# Patient Record
Sex: Male | Born: 1935 | Race: White | Hispanic: No | Marital: Married | Smoking: Former smoker
Health system: Southern US, Community
[De-identification: ages and names within clinical notes are randomized; demographics above are authoritative.]

## PROBLEM LIST (undated history)

## (undated) DIAGNOSIS — I4891 Unspecified atrial fibrillation: Secondary | ICD-10-CM

## (undated) DIAGNOSIS — E119 Type 2 diabetes mellitus without complications: Secondary | ICD-10-CM

## (undated) DIAGNOSIS — I10 Essential (primary) hypertension: Secondary | ICD-10-CM

## (undated) DIAGNOSIS — F0390 Unspecified dementia without behavioral disturbance: Secondary | ICD-10-CM

## (undated) HISTORY — PX: CARDIAC CATHETERIZATION: SHX172

## (undated) HISTORY — PX: CHOLECYSTECTOMY: SHX55

---

## 2005-04-10 ENCOUNTER — Ambulatory Visit: Payer: Self-pay | Admitting: Unknown Physician Specialty

## 2007-07-03 ENCOUNTER — Ambulatory Visit: Payer: Self-pay | Admitting: Unknown Physician Specialty

## 2012-06-06 ENCOUNTER — Emergency Department: Payer: Self-pay | Admitting: Emergency Medicine

## 2012-06-06 LAB — CBC WITH DIFFERENTIAL/PLATELET
Basophil #: 0 10*3/uL (ref 0.0–0.1)
Eosinophil %: 1.4 %
HCT: 39 % — ABNORMAL LOW (ref 40.0–52.0)
Lymphocyte #: 2.1 10*3/uL (ref 1.0–3.6)
MCHC: 32.9 g/dL (ref 32.0–36.0)
MCV: 93 fL (ref 80–100)
Neutrophil %: 62.7 %
Platelet: 279 10*3/uL (ref 150–440)
RBC: 4.2 10*6/uL — ABNORMAL LOW (ref 4.40–5.90)
RDW: 14.1 % (ref 11.5–14.5)
WBC: 9.6 10*3/uL (ref 3.8–10.6)

## 2012-06-06 LAB — URINALYSIS, COMPLETE
Bacteria: NONE SEEN
Blood: NEGATIVE
Glucose,UR: NEGATIVE mg/dL (ref 0–75)
Ketone: NEGATIVE
Leukocyte Esterase: NEGATIVE
WBC UR: <1 /HPF (ref 0–5)

## 2012-06-06 LAB — COMPREHENSIVE METABOLIC PANEL
Albumin: 3.8 g/dL (ref 3.4–5.0)
Anion Gap: 3 — ABNORMAL LOW (ref 7–16)
Bilirubin,Total: 0.8 mg/dL (ref 0.2–1.0)
Calcium, Total: 9.3 mg/dL (ref 8.5–10.1)
Chloride: 102 mmol/L (ref 98–107)
Co2: 32 mmol/L (ref 21–32)
Creatinine: 0.96 mg/dL (ref 0.60–1.30)
EGFR (African American): >60
Glucose: 145 mg/dL — ABNORMAL HIGH (ref 65–99)
Osmolality: 279 (ref 275–301)
SGOT(AST): 30 U/L (ref 15–37)
Total Protein: 8.6 g/dL — ABNORMAL HIGH (ref 6.4–8.2)

## 2012-06-06 LAB — PROTIME-INR
INR: 2.1
Prothrombin Time: 23.9 secs — ABNORMAL HIGH (ref 11.5–14.7)

## 2012-06-06 LAB — APTT: Activated PTT: 36 secs — ABNORMAL HIGH (ref 23.6–35.9)

## 2012-06-24 ENCOUNTER — Ambulatory Visit: Payer: Self-pay | Admitting: Family Medicine

## 2012-10-14 ENCOUNTER — Ambulatory Visit: Payer: Self-pay | Admitting: Specialist

## 2013-04-20 ENCOUNTER — Ambulatory Visit: Payer: Self-pay | Admitting: Specialist

## 2014-04-23 ENCOUNTER — Ambulatory Visit: Payer: Self-pay | Admitting: Specialist

## 2015-09-01 ENCOUNTER — Other Ambulatory Visit: Payer: Self-pay | Admitting: Neurology

## 2015-09-01 DIAGNOSIS — G301 Alzheimer's disease with late onset: Principal | ICD-10-CM

## 2015-09-01 DIAGNOSIS — F0280 Dementia in other diseases classified elsewhere without behavioral disturbance: Secondary | ICD-10-CM

## 2015-09-07 ENCOUNTER — Ambulatory Visit
Admission: RE | Admit: 2015-09-07 | Discharge: 2015-09-07 | Disposition: A | Discharge status: Home / Self Care | Payer: Medicare Other | Source: Ambulatory Visit | Attending: Neurology | Admitting: Neurology

## 2015-09-07 DIAGNOSIS — F0280 Dementia in other diseases classified elsewhere without behavioral disturbance: Secondary | ICD-10-CM

## 2015-09-07 DIAGNOSIS — G301 Alzheimer's disease with late onset: Secondary | ICD-10-CM

## 2015-09-07 DIAGNOSIS — F0390 Unspecified dementia without behavioral disturbance: Secondary | ICD-10-CM | POA: Insufficient documentation

## 2017-05-21 ENCOUNTER — Emergency Department: Payer: Medicare Other

## 2017-05-21 ENCOUNTER — Encounter: Payer: Self-pay | Admitting: Intensive Care

## 2017-05-21 ENCOUNTER — Inpatient Hospital Stay
Admission: EM | Admit: 2017-05-21 | Discharge: 2017-05-28 | DRG: 481 | Disposition: A | Discharge status: Skilled Nursing Facility | Payer: Medicare Other | Attending: Internal Medicine | Admitting: Internal Medicine

## 2017-05-21 DIAGNOSIS — F0150 Vascular dementia without behavioral disturbance: Secondary | ICD-10-CM | POA: Diagnosis present

## 2017-05-21 DIAGNOSIS — G309 Alzheimer's disease, unspecified: Secondary | ICD-10-CM | POA: Diagnosis present

## 2017-05-21 DIAGNOSIS — R1310 Dysphagia, unspecified: Secondary | ICD-10-CM | POA: Diagnosis not present

## 2017-05-21 DIAGNOSIS — R627 Adult failure to thrive: Secondary | ICD-10-CM | POA: Diagnosis not present

## 2017-05-21 DIAGNOSIS — E119 Type 2 diabetes mellitus without complications: Secondary | ICD-10-CM | POA: Diagnosis present

## 2017-05-21 DIAGNOSIS — I340 Nonrheumatic mitral (valve) insufficiency: Secondary | ICD-10-CM | POA: Diagnosis not present

## 2017-05-21 DIAGNOSIS — F0280 Dementia in other diseases classified elsewhere without behavioral disturbance: Secondary | ICD-10-CM | POA: Diagnosis present

## 2017-05-21 DIAGNOSIS — S72141A Displaced intertrochanteric fracture of right femur, initial encounter for closed fracture: Principal | ICD-10-CM | POA: Diagnosis present

## 2017-05-21 DIAGNOSIS — Z66 Do not resuscitate: Secondary | ICD-10-CM | POA: Diagnosis not present

## 2017-05-21 DIAGNOSIS — D62 Acute posthemorrhagic anemia: Secondary | ICD-10-CM | POA: Diagnosis not present

## 2017-05-21 DIAGNOSIS — R791 Abnormal coagulation profile: Secondary | ICD-10-CM | POA: Diagnosis not present

## 2017-05-21 DIAGNOSIS — L8990 Pressure ulcer of unspecified site, unspecified stage: Secondary | ICD-10-CM | POA: Diagnosis present

## 2017-05-21 DIAGNOSIS — M25551 Pain in right hip: Secondary | ICD-10-CM | POA: Diagnosis present

## 2017-05-21 DIAGNOSIS — S72001A Fracture of unspecified part of neck of right femur, initial encounter for closed fracture: Secondary | ICD-10-CM | POA: Diagnosis not present

## 2017-05-21 DIAGNOSIS — I482 Chronic atrial fibrillation: Secondary | ICD-10-CM | POA: Diagnosis present

## 2017-05-21 DIAGNOSIS — Z7984 Long term (current) use of oral hypoglycemic drugs: Secondary | ICD-10-CM | POA: Diagnosis not present

## 2017-05-21 DIAGNOSIS — J449 Chronic obstructive pulmonary disease, unspecified: Secondary | ICD-10-CM | POA: Diagnosis present

## 2017-05-21 DIAGNOSIS — Z7901 Long term (current) use of anticoagulants: Secondary | ICD-10-CM

## 2017-05-21 DIAGNOSIS — E040 Nontoxic diffuse goiter: Secondary | ICD-10-CM

## 2017-05-21 DIAGNOSIS — W1830XA Fall on same level, unspecified, initial encounter: Secondary | ICD-10-CM | POA: Diagnosis present

## 2017-05-21 DIAGNOSIS — E042 Nontoxic multinodular goiter: Secondary | ICD-10-CM | POA: Diagnosis present

## 2017-05-21 DIAGNOSIS — I10 Essential (primary) hypertension: Secondary | ICD-10-CM | POA: Diagnosis present

## 2017-05-21 DIAGNOSIS — Z87891 Personal history of nicotine dependence: Secondary | ICD-10-CM

## 2017-05-21 DIAGNOSIS — Z79899 Other long term (current) drug therapy: Secondary | ICD-10-CM

## 2017-05-21 DIAGNOSIS — T148XXA Other injury of unspecified body region, initial encounter: Secondary | ICD-10-CM

## 2017-05-21 DIAGNOSIS — Z515 Encounter for palliative care: Secondary | ICD-10-CM | POA: Diagnosis not present

## 2017-05-21 DIAGNOSIS — E876 Hypokalemia: Secondary | ICD-10-CM | POA: Diagnosis not present

## 2017-05-21 HISTORY — DX: Unspecified atrial fibrillation: I48.91

## 2017-05-21 HISTORY — DX: Essential (primary) hypertension: I10

## 2017-05-21 HISTORY — DX: Unspecified dementia, unspecified severity, without behavioral disturbance, psychotic disturbance, mood disturbance, and anxiety: F03.90

## 2017-05-21 HISTORY — DX: Type 2 diabetes mellitus without complications: E11.9

## 2017-05-21 LAB — GLUCOSE, CAPILLARY: GLUCOSE-CAPILLARY: 179 mg/dL — AB (ref 65–99)

## 2017-05-21 LAB — CBC WITH DIFFERENTIAL/PLATELET
BASOS ABS: 0 10*3/uL (ref 0–0.1)
Basophils Relative: 1 %
EOS PCT: 2 %
Eosinophils Absolute: 0.2 10*3/uL (ref 0–0.7)
HCT: 36.5 % — ABNORMAL LOW (ref 40.0–52.0)
HEMOGLOBIN: 12.3 g/dL — AB (ref 13.0–18.0)
LYMPHS PCT: 34 %
Lymphs Abs: 2.7 10*3/uL (ref 1.0–3.6)
MCH: 31.9 pg (ref 26.0–34.0)
MCHC: 33.6 g/dL (ref 32.0–36.0)
MCV: 95.1 fL (ref 80.0–100.0)
Monocytes Absolute: 1 10*3/uL (ref 0.2–1.0)
Monocytes Relative: 13 %
NEUTROS ABS: 4 10*3/uL (ref 1.4–6.5)
NEUTROS PCT: 50 %
PLATELETS: 227 10*3/uL (ref 150–440)
RBC: 3.84 MIL/uL — AB (ref 4.40–5.90)
RDW: 14.3 % (ref 11.5–14.5)
WBC: 7.9 10*3/uL (ref 3.8–10.6)

## 2017-05-21 LAB — COMPREHENSIVE METABOLIC PANEL
ALT: 17 U/L (ref 17–63)
ANION GAP: 7 (ref 5–15)
AST: 31 U/L (ref 15–41)
Albumin: 3.7 g/dL (ref 3.5–5.0)
Alkaline Phosphatase: 66 U/L (ref 38–126)
BILIRUBIN TOTAL: 1 mg/dL (ref 0.3–1.2)
BUN: 20 mg/dL (ref 6–20)
CHLORIDE: 103 mmol/L (ref 101–111)
CO2: 29 mmol/L (ref 22–32)
Calcium: 9.2 mg/dL (ref 8.9–10.3)
Creatinine, Ser: 0.88 mg/dL (ref 0.61–1.24)
Glucose, Bld: 170 mg/dL — ABNORMAL HIGH (ref 65–99)
POTASSIUM: 4.8 mmol/L (ref 3.5–5.1)
Sodium: 139 mmol/L (ref 135–145)
TOTAL PROTEIN: 7.2 g/dL (ref 6.5–8.1)

## 2017-05-21 LAB — URINALYSIS, COMPLETE (UACMP) WITH MICROSCOPIC
Bacteria, UA: NONE SEEN
Bilirubin Urine: NEGATIVE
GLUCOSE, UA: NEGATIVE mg/dL
KETONES UR: NEGATIVE mg/dL
LEUKOCYTES UA: NEGATIVE
NITRITE: NEGATIVE
PH: 7 (ref 5.0–8.0)
Protein, ur: NEGATIVE mg/dL
Specific Gravity, Urine: 1.008 (ref 1.005–1.030)
Squamous Epithelial / LPF: NONE SEEN
WBC UA: NONE SEEN WBC/hpf (ref 0–5)

## 2017-05-21 LAB — TYPE AND SCREEN
ABO/RH(D): O POS
Antibody Screen: NEGATIVE

## 2017-05-21 LAB — PROTIME-INR
INR: 2.85
Prothrombin Time: 30.5 seconds — ABNORMAL HIGH (ref 11.4–15.2)

## 2017-05-21 LAB — SURGICAL PCR SCREEN
MRSA, PCR: NEGATIVE
STAPHYLOCOCCUS AUREUS: POSITIVE — AB

## 2017-05-21 MED ORDER — FINASTERIDE 5 MG PO TABS
5.0000 mg | ORAL_TABLET | Freq: Every evening | ORAL | Status: DC
  Administered 2017-05-21 – 2017-05-23 (×3): 5 mg via ORAL
  Filled 2017-05-21 (×3): qty 1

## 2017-05-21 MED ORDER — FENTANYL CITRATE (PF) 100 MCG/2ML IJ SOLN: 12.5000 ug | Freq: Four times a day (QID) | INTRAMUSCULAR | Status: DC | PRN

## 2017-05-21 MED ORDER — LISINOPRIL 20 MG PO TABS
20.0000 mg | ORAL_TABLET | Freq: Every day | ORAL | Status: DC
  Administered 2017-05-22 – 2017-05-26 (×3): 20 mg via ORAL
  Filled 2017-05-21 (×4): qty 1

## 2017-05-21 MED ORDER — HYDROCODONE-ACETAMINOPHEN 5-325 MG PO TABS
1.0000 | ORAL_TABLET | ORAL | Status: DC | PRN
  Administered 2017-05-22 (×2): 1 via ORAL
  Filled 2017-05-21 (×2): qty 1

## 2017-05-21 MED ORDER — GLIPIZIDE 10 MG PO TABS
5.0000 mg | ORAL_TABLET | Freq: Every day | ORAL | Status: DC
  Administered 2017-05-21: 5 mg via ORAL
  Filled 2017-05-21: qty 0.5

## 2017-05-21 MED ORDER — AMLODIPINE BESYLATE 10 MG PO TABS
10.0000 mg | ORAL_TABLET | Freq: Every day | ORAL | Status: DC
  Administered 2017-05-22 – 2017-05-26 (×3): 10 mg via ORAL
  Filled 2017-05-21 (×3): qty 1

## 2017-05-21 MED ORDER — ACETAMINOPHEN 325 MG PO TABS
650.0000 mg | ORAL_TABLET | Freq: Four times a day (QID) | ORAL | Status: DC | PRN
  Administered 2017-05-21 – 2017-05-23 (×3): 650 mg via ORAL
  Filled 2017-05-21 (×3): qty 2

## 2017-05-21 MED ORDER — ONDANSETRON HCL 4 MG PO TABS: 4.0000 mg | ORAL_TABLET | Freq: Four times a day (QID) | ORAL | Status: DC | PRN

## 2017-05-21 MED ORDER — FENTANYL CITRATE (PF) 100 MCG/2ML IJ SOLN
12.5000 ug | Freq: Once | INTRAMUSCULAR | Status: AC
  Administered 2017-05-21: 12.5 ug via INTRAVENOUS
  Filled 2017-05-21: qty 2

## 2017-05-21 MED ORDER — INSULIN ASPART 100 UNIT/ML ~~LOC~~ SOLN: 0.0000 [IU] | Freq: Every day | SUBCUTANEOUS | Status: DC

## 2017-05-21 MED ORDER — PHYTONADIONE 5 MG PO TABS
5.0000 mg | ORAL_TABLET | Freq: Once | ORAL | Status: DC
  Filled 2017-05-21: qty 1

## 2017-05-21 MED ORDER — MEMANTINE HCL 5 MG PO TABS
10.0000 mg | ORAL_TABLET | Freq: Two times a day (BID) | ORAL | Status: DC
  Administered 2017-05-21 – 2017-05-26 (×6): 10 mg via ORAL
  Filled 2017-05-21 (×8): qty 2

## 2017-05-21 MED ORDER — CLINDAMYCIN PHOSPHATE 600 MG/50ML IV SOLN
600.0000 mg | INTRAVENOUS | Status: AC
  Filled 2017-05-21: qty 50

## 2017-05-21 MED ORDER — INSULIN ASPART 100 UNIT/ML ~~LOC~~ SOLN
0.0000 [IU] | Freq: Three times a day (TID) | SUBCUTANEOUS | Status: DC
  Administered 2017-05-21: 2 [IU] via SUBCUTANEOUS
  Administered 2017-05-22: 1 [IU] via SUBCUTANEOUS
  Administered 2017-05-22: 3 [IU] via SUBCUTANEOUS
  Administered 2017-05-22: 1 [IU] via SUBCUTANEOUS
  Administered 2017-05-23: 2 [IU] via SUBCUTANEOUS
  Administered 2017-05-23 – 2017-05-24 (×3): 1 [IU] via SUBCUTANEOUS
  Administered 2017-05-25 – 2017-05-26 (×4): 2 [IU] via SUBCUTANEOUS
  Administered 2017-05-26: 1 [IU] via SUBCUTANEOUS
  Administered 2017-05-26: 2 [IU] via SUBCUTANEOUS
  Administered 2017-05-27 – 2017-05-28 (×4): 1 [IU] via SUBCUTANEOUS
  Administered 2017-05-28: 2 [IU] via SUBCUTANEOUS
  Filled 2017-05-21: qty 1
  Filled 2017-05-21: qty 2
  Filled 2017-05-21 (×3): qty 1
  Filled 2017-05-21 (×2): qty 2
  Filled 2017-05-21 (×2): qty 1
  Filled 2017-05-21 (×2): qty 2
  Filled 2017-05-21: qty 3
  Filled 2017-05-21 (×3): qty 1
  Filled 2017-05-21 (×3): qty 2
  Filled 2017-05-21: qty 1

## 2017-05-21 MED ORDER — DONEPEZIL HCL 5 MG PO TABS
10.0000 mg | ORAL_TABLET | Freq: Every evening | ORAL | Status: DC
Start: 2017-05-21 — End: 2017-05-28
  Administered 2017-05-21 – 2017-05-23 (×3): 10 mg via ORAL
  Filled 2017-05-21 (×3): qty 2

## 2017-05-21 MED ORDER — ONDANSETRON HCL 4 MG/2ML IJ SOLN: 4.0000 mg | Freq: Four times a day (QID) | INTRAMUSCULAR | Status: DC | PRN

## 2017-05-21 MED ORDER — SIMVASTATIN 10 MG PO TABS
10.0000 mg | ORAL_TABLET | Freq: Every evening | ORAL | Status: DC
  Administered 2017-05-21 – 2017-05-23 (×3): 10 mg via ORAL
  Filled 2017-05-21 (×3): qty 1

## 2017-05-21 MED ORDER — ACETAMINOPHEN 650 MG RE SUPP: 650.0000 mg | Freq: Four times a day (QID) | RECTAL | Status: DC | PRN

## 2017-05-21 NOTE — Consult Note (Signed)
ORTHOPAEDIC CONSULTATION  PATIENT NAME: Spencer Little DOB: [birth year]  MRN: [id number]  REQUESTING PHYSICIAN: Enid Baas, MD  Chief Complaint: Right hip pain  HPI: Spencer Little is a 83 y.o. male who complains of  right hip pain. The patient was in the yard and sustained a mechanical fall, landing on his right hip and side. He was unable stand or bear weight due to the right hip pain. He denied any loss of consciousness. He denied any other injuries. Prior to the fall he was encouraged to use a walker for ambulation due to some unsteadiness, but his daughter states that he frequently tries to ambulate without assistance.  The patient has a history of dementia but lives at home. His wife is currently recovering from hip fracture at a local skilled nursing facility. His daughter was with him in the hospital room this evening.  Past Medical History:  Diagnosis Date  . Atrial fibrillation (HCC)   . 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 Diabetes (HCC)   . Hypertension    Past Surgical History:  Procedure Laterality Date  . CARDIAC CATHETERIZATION    . CHOLECYSTECTOMY     Social History   Social History  . Marital status: Married    Spouse name: N/A  . Number of children: N/A  . Years of education: N/A   Social History Main Topics  . Smoking status: Former Games developer  . Smokeless tobacco: Never Used  . Alcohol use No  . Drug use: Unknown  . Sexual activity: Not Asked   Other Topics Concern  . None   Social History Narrative   Lives at home with his wife, has unsteady gait but no cane or walker until now.   Family History  Problem Relation Age of Onset  . CAD Mother   . Diabetes Mother   . Hypertension Mother    Allergies  Allergen Reactions  . Penicillins    Prior to Admission medications   Medication Sig Start Date End Date Taking? Authorizing Provider  amLODipine (NORVASC) 10 MG tablet Take 10 mg by mouth daily.   Yes  [provider]  donepezil (ARICEPT) 10 MG tablet Take 10 mg by mouth every evening.  01/04/17  Yes [provider]  finasteride (PROSCAR) 5 MG tablet Take 5 mg by mouth every evening.    Yes [provider]  fosinopril (MONOPRIL) 20 MG tablet Take 20 mg by mouth daily.   Yes [provider]  furosemide (LASIX) 40 MG tablet Take 40 mg by mouth daily.   Yes [provider]  glipiZIDE (GLUCOTROL) 5 MG tablet Take 5 mg by mouth every evening.    Yes [provider]  memantine (NAMENDA) 10 MG tablet Take 10 mg by mouth 2 (two) times daily.  01/08/17  Yes [provider]  simvastatin (ZOCOR) 10 MG tablet Take 10 mg by mouth every evening.   Yes [provider]  warfarin (COUMADIN) 4 MG tablet Take 4 mg by mouth daily at 6 PM.    Yes [provider]   Ct Head Wo Contrast  Result Date: 05/21/2017 CLINICAL DATA:  Fall history of dementia EXAM: CT HEAD WITHOUT CONTRAST CT CERVICAL SPINE WITHOUT CONTRAST TECHNIQUE: Multidetector CT imaging of the head and cervical spine was performed following the standard protocol without intravenous contrast. Multiplanar CT image reconstructions of the cervical spine were also generated. COMPARISON:  09/07/2015 FINDINGS: CT HEAD FINDINGS Brain: No acute territorial infarction,  hemorrhage or intracranial mass is seen. Mild periventricular small vessel white matter ischemic changes. Moderate atrophy. Stable ventricle size and configuration. Vascular: No hyperdense vessels. Scattered calcifications at the carotid siphons. Skull: No skull fracture.  No suspicious bone lesion. Sinuses/Orbits: Mucosal thickening or retention cysts in the maxillary and ethmoid sinuses. No acute orbital abnormality. Bilateral lens extraction. Other: None CT CERVICAL SPINE FINDINGS Alignment: Exaggeration of the cervical lordosis. No subluxation. Facet alignment within normal limits. Skull base and vertebrae: Craniovertebral  junction appears intact. There is no fracture visualized. Soft tissues and spinal canal: No prevertebral fluid or swelling. No visible canal hematoma. Disc levels: Multilevel diffuse moderate severe degenerative changes from C3 through T1. Multiple levels of foraminal stenosis and facet arthropathy. Upper chest: Lung apices clear. Multiple thyroid nodules measuring up to 2 cm. Mild carotid artery calcification. Other: None IMPRESSION: 1. No CT evidence for acute intracranial abnormality. 2. Exaggerated cervical lordosis.  No acute fracture seen 3. Multiple thyroid nodules. Nonemergent thyroid ultrasound follow-up as clinically indicated. Electronically Signed   By: Jasmine PangKim  Fujinaga M.D.   On: 05/21/2017 15:19   Ct Cervical Spine Wo Contrast  Result Date: 05/21/2017 CLINICAL DATA:  Fall history of dementia EXAM: CT HEAD WITHOUT CONTRAST CT CERVICAL SPINE WITHOUT CONTRAST TECHNIQUE: Multidetector CT imaging of the head and cervical spine was performed following the standard protocol without intravenous contrast. Multiplanar CT image reconstructions of the cervical spine were also generated. COMPARISON:  09/07/2015 FINDINGS: CT HEAD FINDINGS Brain: No acute territorial infarction, hemorrhage or intracranial mass is seen. Mild periventricular small vessel white matter ischemic changes. Moderate atrophy. Stable ventricle size and configuration. Vascular: No hyperdense vessels. Scattered calcifications at the carotid siphons. Skull: No skull fracture.  No suspicious bone lesion. Sinuses/Orbits: Mucosal thickening or retention cysts in the maxillary and ethmoid sinuses. No acute orbital abnormality. Bilateral lens extraction. Other: None CT CERVICAL SPINE FINDINGS Alignment: Exaggeration of the cervical lordosis. No subluxation. Facet alignment within normal limits. Skull base and vertebrae: Craniovertebral junction appears intact. There is no fracture visualized. Soft tissues and spinal canal: No prevertebral fluid or  swelling. No visible canal hematoma. Disc levels: Multilevel diffuse moderate severe degenerative changes from C3 through T1. Multiple levels of foraminal stenosis and facet arthropathy. Upper chest: Lung apices clear. Multiple thyroid nodules measuring up to 2 cm. Mild carotid artery calcification. Other: None IMPRESSION: 1. No CT evidence for acute intracranial abnormality. 2. Exaggerated cervical lordosis.  No acute fracture seen 3. Multiple thyroid nodules. Nonemergent thyroid ultrasound follow-up as clinically indicated. Electronically Signed   By: Jasmine PangKim  Fujinaga M.D.   On: 05/21/2017 15:19   Dg Hip Unilat W Or Wo Pelvis 2-3 Views Right  Result Date: 05/21/2017 CLINICAL DATA:  Fall at home today with right hip pain. EXAM: DG HIP (WITH OR WITHOUT PELVIS) 2-3V RIGHT COMPARISON:  None. FINDINGS: There is generalized osteopenia. There are mild degenerative changes of the hips. There is a displaced trochanteric fracture of the right hip. There is mild displacement of the lesser trochanter. Degenerative change of the spine. IMPRESSION: Displaced right trochanteric fracture. Electronically Signed   By: Elberta Fortisaniel  Boyle M.D.   On: 05/21/2017 15:28    Positive ROS: All other systems have been reviewed and were otherwise negative with the exception of those mentioned in the HPI and as above.  Physical Exam: General: Well developed, well nourished male seen in no acute distress. HEENT: Atraumatic and normocephalic. Sclera are clear. Extraocular motion is intact. Oropharynx is clear with moist mucosa. Neck: Supple,  nontender, good range of motion. No JVD or carotid bruits. Lungs: Clear to auscultation bilaterally. Cardiovascular: Irregular rate and rhythm with normal S1 and S2. 2/6 murmurs. No gallops or rubs. Pedal pulses are palpable bilaterally. Homans test is negative bilaterally. No significant pretibial or ankle edema. Abdomen: Soft, nontender, and nondistended. Bowel sounds are present. Skin: No lesions  in the area of chief complaint Neurologic: Awake, alert, and oriented to self. Sensory function is grossly intact. Motor strength is felt to be 5 over 5 with the exception of the right lower extremity which was not assessed due to the injury. No clonus or tremor. Good motor coordination. Lymphatic: No axillary or cervical lymphadenopathy  MUSCULOSKELETAL: Pertinent examination involves findings regarding the right lower extremity. The right lower extremity is shortened and externally rotated. There is no gross ecchymosis to the hip or thigh, although there does appear to be some soft tissue swelling proximally. Pain is elicited with passive range of motion of the right hip. No knee effusion. No gross tenderness to palpation about the knee.  Radiographs: I reviewed AP pelvis AP and lateral radiographs of the right hip that were obtained in the Emergency Department earlier today. There is a comminuted peritrochanteric femur fracture with a large lateral fragment that extends into the subtrochanteric region. Good preservation of the cartilage space to the right hip.  Assessment: Comminuted right peritrochanteric/subtrochanteric femur fracture  Plan: The findings were discussed in detail with the patient and his daughter. Recommendations are made for open reduction and internal fixation of the right peritrochanteric femur fracture.. The usual perioperative course was discussed. The risks and benefits of surgical intervention were reviewed. The patient and his daughter expressed understanding of the risks and benefits and agreed with plans for surgical intervention.   Of note, the patient is on anticoagulation with Coumadin with an INR of 2.85 at admission. I would ideally like the INR normalized to less than 1.4 so as to minimize bleeding risk. He has apparently been treated with one dose of vitamin K and a repeat prothrombin time/INR is scheduled for the morning. Depending upon these values, we may  proceed with surgery tomorrow versus postponing until optimal normalization has been achieved.  The surgical site was signed as per the "right site surgery" protocol.   Markie Frith P. Angie Fava M.D.

## 2017-05-21 NOTE — ED Notes (Signed)
Attempted to insert 26F catheter, unable to pass. Pt's urethral opening too small for size of catheter

## 2017-05-21 NOTE — ED Notes (Signed)
ED Provider at bedside. 

## 2017-05-21 NOTE — ED Provider Notes (Signed)
[facility] Emergency Department Provider Note  ____________________________________________   I have reviewed the triage vital signs and the nursing notes.   HISTORY  Chief Complaint Leg Injury (right)    HPI Spencer Little is a 83 y.o. male who presents today complaining of hip pain after a non-syncopal fall. The patient states that he was weeding in his garden and he fell. He did not hit his head he is on Coumadin. He denies clear recollection of the event however. There was no syncope. He has no chest pain or shortness of breath and otherwise he states he feels okay. He did receive pain medication from EMS which makes him feel "swimmy headed" otherwise he is in no acute distress and denies other injury.    Past Medical History:  Diagnosis Date  . Dementia     There are no active problems to display for this patient.   History reviewed. No pertinent surgical history.  Prior to Admission medications   Medication Sig Start Date End Date Taking? Authorizing Provider  donepezil (ARICEPT) 10 MG tablet Take 10 mg by mouth daily. 01/04/17  Yes [provider]  memantine (NAMENDA) 10 MG tablet Take 10 mg by mouth daily. 01/08/17  Yes [provider]  amLODipine (NORVASC) 10 MG tablet Take 10 mg by mouth daily.    [provider]  finasteride (PROSCAR) 5 MG tablet Take 5 mg by mouth daily.    [provider]  fosinopril (MONOPRIL) 20 MG tablet Take 20 mg by mouth daily.    [provider]  furosemide (LASIX) 40 MG tablet Take 40 mg by mouth daily.    [provider]  glipiZIDE (GLUCOTROL) 5 MG tablet Take 5 mg by mouth daily.    [provider]  simvastatin (ZOCOR) 10 MG tablet Take 10 mg by mouth every evening.    [provider]  warfarin (COUMADIN) 4 MG tablet Take 4 mg by mouth See admin instructions. On Saturday and Sunday    [provider]    Allergies Patient has no  known allergies.  History reviewed. No pertinent family history.  Social History Social History  Substance Use Topics  . Smoking status: Never Smoker  . Smokeless tobacco: Never Used  . Alcohol use No    Review of Systems Constitutional: No fever/chills Eyes: No visual changes. ENT: No sore throat. No stiff neck no neck pain Cardiovascular: Denies chest pain. Respiratory: Denies shortness of breath. Gastrointestinal:   no vomiting.  No diarrhea.  No constipation. Genitourinary: Negative for dysuria. Musculoskeletal: Negative lower extremity swelling Skin: Negative for rash. Neurological: Negative for severe headaches, focal weakness or numbness.   ____________________________________________   PHYSICAL EXAM:  VITAL SIGNS: ED Triage Vitals  Enc Vitals Group     BP 05/21/17 1347 (!) 146/119     Pulse Rate 05/21/17 1347 87     Resp 05/21/17 1347 20     Temp --      Temp src --      SpO2 05/21/17 1347 96 %     Weight 05/21/17 1344 165 lb (74.8 kg)     Height 05/21/17 1344 6\' 1"  (1.854 m)     Head Circumference --      Peak Flow --      Pain Score 05/21/17 1340 10     Pain Loc --      Pain Edu? --      Excl. in GC? --     Constitutional:  Alert and orientedTo name and place unsure of the date pleasantly demented in no acute distress at baseline per family Eyes: Conjunctivae are normal Head: Atraumatic HEENT: No congestion/rhinnorhea. Mucous membranes are moist.  Oropharynx non-erythematous Neck:   Nontender with no meningismus, no masses, no stridor Cardiovascular: Normal rate, regular rhythm. Grossly normal heart sounds.  Good peripheral circulation. Respiratory: Normal respiratory effort.  No retractions. Lungs CTAB. Abdominal: Soft and nontender. No distention. No guarding no rebound Back:  There is no focal tenderness or step off.  there is no midline tenderness there are no lesions noted. there is no CVA tenderness  Musculoskeletal: There is tenderness and  deformity to his right hip no evidence of open fracture however. No joint effusions, no DVT signs strong distal pulses no edema Neurologic:  Normal speech and language. No gross focal neurologic deficits are appreciated.  Skin:  Skin is warm, dry and intact. No rash noted. Psychiatric: Mood and affect are normal. Speech and behavior are normal.  ____________________________________________   LABS (all labs ordered are listed, but only abnormal results are displayed)  Labs Reviewed  COMPREHENSIVE METABOLIC PANEL - Abnormal; Notable for the following:       Result Value   Glucose, Bld 170 (*)    All other components within normal limits  CBC WITH DIFFERENTIAL/PLATELET - Abnormal; Notable for the following:    RBC 3.84 (*)    Hemoglobin 12.3 (*)    HCT 36.5 (*)    All other components within normal limits  PROTIME-INR - Abnormal; Notable for the following:    Prothrombin Time 30.5 (*)    All other components within normal limits  URINALYSIS, COMPLETE (UACMP) WITH MICROSCOPIC - Abnormal; Notable for the following:    Color, Urine YELLOW (*)    APPearance CLEAR (*)    Hgb urine dipstick SMALL (*)    All other components within normal limits  TYPE AND SCREEN   ____________________________________________  EKG  I personally interpreted any EKGs ordered by me or triage Sinus rhythm, frequent PVCs noted, no acute ST elevation or ST depression, nonspecific IVCD, rate 90 bpm ____________________________________________  RADIOLOGY  I reviewed any imaging ordered by me or triage that were performed during my shift and, if possible, patient and/or family made aware of any abnormal findings. ____________________________________________   PROCEDURES  Procedure(s) performed: None  Procedures  Critical Care performed: None  ____________________________________________   INITIAL IMPRESSION / ASSESSMENT AND PLAN / ED COURSE  Pertinent labs & imaging results that were available  during my care of the patient were reviewed by me and considered in my medical decision making (see chart for details).  Patient with a hip fracture, no other obvious injury noted. Because of Coumadin and dementia at his CT of the head which is reassuring. Preop workup is performed. He will be admitted to the hospitalist service.    ____________________________________________   FINAL CLINICAL IMPRESSION(S) / ED DIAGNOSES  Final diagnoses:  None      This chart was dictated using voice recognition software.  Despite best efforts to proofread,  errors can occur which can change meaning.      Jeanmarie PlantMcShane, Nashawn Hillock A, MD 05/21/17 1535

## 2017-05-21 NOTE — ED Notes (Signed)
Report to Melanie, RN

## 2017-05-21 NOTE — H&P (Signed)
[employer] [facility] [facility] [place] [facility]   PATIENT NAME: Spencer Little    MR#:  [id number]  DATE OF BIRTH:  1924-08-23  DATE OF ADMISSION:  05/21/2017  PRIMARY CARE PHYSICIAN: Veneda Melter, FNP   REQUESTING/REFERRING PHYSICIAN: Dr. Ileana Roup  CHIEF COMPLAINT:   Chief Complaint  Patient presents with  . Leg Injury    right    HISTORY OF PRESENT ILLNESS:  Spencer Little  is a 83 y.o. male with a known history of Atrial fibrillation on Coumadin, moderate to severe dementia due to Alzheimer's and vascular dementia, non-insulin-dependent diabetes mellitus presents to the hospital secondary to a fall and right hip fracture. Patient is oriented to self at this time. Unable to provide most of the history. History obtained from his son at bedside. Patient is independent at baseline except for his memory. He lives at home with his wife, but his wife has been at [employer] as she is recovering from a hip fracture. He has a caregiver during the daytime. Patient was at home pulling some beets and had a mechanical fall and fell onto his right side and has been complaining of back pain and right leg pain since then. Denies any syncope, no chest pain or palpitations. X-ray here noted to have right intertrochanteric fracture and is being admitted for the same. Patient denies any chest pain at this time. EKG reveals atrial fibrillation, rate controlled. He takes Coumadin at home, INR is therapeutic.  PAST MEDICAL HISTORY:   Past Medical History:  Diagnosis Date  . Atrial fibrillation (HCC)   . 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 Diabetes (HCC)     PAST SURGICAL HISTORY:   Past Surgical History:  Procedure Laterality Date  . CARDIAC CATHETERIZATION    . CHOLECYSTECTOMY      SOCIAL HISTORY:   Social History  Substance Use Topics  . Smoking status: Former Games developer  . Smokeless tobacco: Never Used  . Alcohol use No    FAMILY  HISTORY:   Family History  Problem Relation Age of Onset  . CAD Mother   . Diabetes Mother   . Hypertension Mother     DRUG ALLERGIES:  No Known Allergies  REVIEW OF SYSTEMS:   Review of Systems  Unable to perform ROS: Dementia    MEDICATIONS AT HOME:   Prior to Admission medications   Medication Sig Start Date End Date Taking? Authorizing Provider  amLODipine (NORVASC) 10 MG tablet Take 10 mg by mouth daily.   Yes [provider]  donepezil (ARICEPT) 10 MG tablet Take 10 mg by mouth every evening.  01/04/17  Yes [provider]  finasteride (PROSCAR) 5 MG tablet Take 5 mg by mouth every evening.    Yes [provider]  fosinopril (MONOPRIL) 20 MG tablet Take 20 mg by mouth daily.   Yes [provider]  furosemide (LASIX) 40 MG tablet Take 40 mg by mouth daily.   Yes [provider]  glipiZIDE (GLUCOTROL) 5 MG tablet Take 5 mg by mouth every evening.    Yes [provider]  memantine (NAMENDA) 10 MG tablet Take 10 mg by mouth 2 (two) times daily.  01/08/17  Yes [provider]  simvastatin (ZOCOR) 10 MG tablet Take 10 mg by mouth every evening.   Yes [provider]  warfarin (COUMADIN) 4 MG tablet Take 4 mg by mouth daily at 6 PM.    Yes [provider]      VITAL SIGNS:  Blood pressure 126/83, pulse 61, resp. rate 19, height 6\' 1"  (1.854 m), weight 74.8 kg (165 lb), SpO2 98 %.  PHYSICAL EXAMINATION:   Physical Exam  GENERAL:  83 y.o.-year-old patient lying in the bed, in acute distress secondary to right hip pain.  EYES: Pupils equal, round, reactive to light and accommodation. No scleral icterus. Extraocular muscles intact.  HEENT: Head atraumatic, normocephalic. Oropharynx and nasopharynx clear.  NECK:  Supple, no jugular venous distention. No thyroid enlargement, no tenderness.  LUNGS: Normal breath sounds bilaterally, no wheezing, rales,rhonchi or crepitation. No use of accessory muscles  of respiration. Decreased bibasilar breath sounds noted. CARDIOVASCULAR: S1, S2 normal. No rubs, or gallops. 2/6 systolic murmur present ABDOMEN: Soft, nontender, nondistended. Bowel sounds present. No organomegaly or mass.  EXTREMITIES: right hip lateral thigh swelling noted, varicose veins on the leg, right leg is shorter, abducted and externally rotated. No pedal edema, cyanosis, or clubbing.  NEUROLOGIC: Cranial nerves II through XII are intact. Muscle strength 5/5 in all extremities except right leg due to pain. Global weakness noted. Sensation intact. Gait not checked.  PSYCHIATRIC: The patient is alert and oriented x 1.  SKIN: No obvious rash, lesion, or ulcer.   LABORATORY PANEL:   CBC  Recent Labs Lab 05/21/17 1348  WBC 7.9  HGB 12.3*  HCT 36.5*  PLT 227   ------------------------------------------------------------------------------------------------------------------  Chemistries   Recent Labs Lab 05/21/17 1348  NA 139  K 4.8  CL 103  CO2 29  GLUCOSE 170*  BUN 20  CREATININE 0.88  CALCIUM 9.2  AST 31  ALT 17  ALKPHOS 66  BILITOT 1.0   ------------------------------------------------------------------------------------------------------------------  Cardiac Enzymes No results for input(s): TROPONINI in the last 168 hours. ------------------------------------------------------------------------------------------------------------------  RADIOLOGY:  Ct Head Wo Contrast  Result Date: 05/21/2017 CLINICAL DATA:  Fall history of dementia EXAM: CT HEAD WITHOUT CONTRAST CT CERVICAL SPINE WITHOUT CONTRAST TECHNIQUE: Multidetector CT imaging of the head and cervical spine was performed following the standard protocol without intravenous contrast. Multiplanar CT image reconstructions of the cervical spine were also generated. COMPARISON:  09/07/2015 FINDINGS: CT HEAD FINDINGS Brain: No acute territorial infarction, hemorrhage or intracranial mass is seen. Mild  periventricular small vessel white matter ischemic changes. Moderate atrophy. Stable ventricle size and configuration. Vascular: No hyperdense vessels. Scattered calcifications at the carotid siphons. Skull: No skull fracture.  No suspicious bone lesion. Sinuses/Orbits: Mucosal thickening or retention cysts in the maxillary and ethmoid sinuses. No acute orbital abnormality. Bilateral lens extraction. Other: None CT CERVICAL SPINE FINDINGS Alignment: Exaggeration of the cervical lordosis. No subluxation. Facet alignment within normal limits. Skull base and vertebrae: Craniovertebral junction appears intact. There is no fracture visualized. Soft tissues and spinal canal: No prevertebral fluid or swelling. No visible canal hematoma. Disc levels: Multilevel diffuse moderate severe degenerative changes from C3 through T1. Multiple levels of foraminal stenosis and facet arthropathy. Upper chest: Lung apices clear. Multiple thyroid nodules measuring up to 2 cm. Mild carotid artery calcification. Other: None IMPRESSION: 1. No CT evidence for acute intracranial abnormality. 2. Exaggerated cervical lordosis.  No acute fracture seen 3. Multiple thyroid nodules. Nonemergent thyroid ultrasound follow-up as clinically indicated. Electronically Signed   By: Jasmine Pang M.D.   On: 05/21/2017 15:19   Ct Cervical Spine Wo Contrast  Result Date: 05/21/2017 CLINICAL DATA:  Fall history of dementia EXAM: CT HEAD WITHOUT CONTRAST CT CERVICAL SPINE WITHOUT CONTRAST TECHNIQUE: Multidetector CT imaging of the head and cervical spine  was performed following the standard protocol without intravenous contrast. Multiplanar CT image reconstructions of the cervical spine were also generated. COMPARISON:  09/07/2015 FINDINGS: CT HEAD FINDINGS Brain: No acute territorial infarction, hemorrhage or intracranial mass is seen. Mild periventricular small vessel white matter ischemic changes. Moderate atrophy. Stable ventricle size and  configuration. Vascular: No hyperdense vessels. Scattered calcifications at the carotid siphons. Skull: No skull fracture.  No suspicious bone lesion. Sinuses/Orbits: Mucosal thickening or retention cysts in the maxillary and ethmoid sinuses. No acute orbital abnormality. Bilateral lens extraction. Other: None CT CERVICAL SPINE FINDINGS Alignment: Exaggeration of the cervical lordosis. No subluxation. Facet alignment within normal limits. Skull base and vertebrae: Craniovertebral junction appears intact. There is no fracture visualized. Soft tissues and spinal canal: No prevertebral fluid or swelling. No visible canal hematoma. Disc levels: Multilevel diffuse moderate severe degenerative changes from C3 through T1. Multiple levels of foraminal stenosis and facet arthropathy. Upper chest: Lung apices clear. Multiple thyroid nodules measuring up to 2 cm. Mild carotid artery calcification. Other: None IMPRESSION: 1. No CT evidence for acute intracranial abnormality. 2. Exaggerated cervical lordosis.  No acute fracture seen 3. Multiple thyroid nodules. Nonemergent thyroid ultrasound follow-up as clinically indicated. Electronically Signed   By: Jasmine PangKim  Fujinaga M.D.   On: 05/21/2017 15:19   Dg Hip Unilat W Or Wo Pelvis 2-3 Views Right  Result Date: 05/21/2017 CLINICAL DATA:  Fall at home today with right hip pain. EXAM: DG HIP (WITH OR WITHOUT PELVIS) 2-3V RIGHT COMPARISON:  None. FINDINGS: There is generalized osteopenia. There are mild degenerative changes of the hips. There is a displaced trochanteric fracture of the right hip. There is mild displacement of the lesser trochanter. Degenerative change of the spine. IMPRESSION: Displaced right trochanteric fracture. Electronically Signed   By: Elberta Fortisaniel  Boyle M.D.   On: 05/21/2017 15:28    EKG:   Orders placed or performed during the hospital encounter of 05/21/17  . EKG 12-Lead  . EKG 12-Lead  . ED EKG  . ED EKG    IMPRESSION AND PLAN:   Spencer Little   is a 83 y.o. male with a known history of Atrial fibrillation on Coumadin, moderate to severe dementia due to Alzheimer's and vascular dementia, non-insulin-dependent diabetes mellitus presents to the hospital secondary to a fall and right hip fracture.  #1 Right hip fracture- due to mechanical fall - orthopedics consulted, NPO after midnight - moderate risk for non cardiac surgery. INR at 2.8 - will give 1 dose of oral Vit K and check INR in AM, can use  FFP if needed prior to surgery if INR still >1.5 - Pain management. Physical therapy after the surgery.  #2 hypertension-continue lisinopril, Norvasc. Hold Lasix.   #3 diabetes mellitus-on glipizide. Also on sliding scale insulin.  #4 dementia-follows with neurology as outpatient. Continue Aricept and Namenda. Conversational and oriented to self at baseline.  #5 DVT prophylaxis-will be started after surgery  #6 atrial fibrillation-chronic, stable. Heart rate on the lower side, so not on any rate limiting medications. -Coumadin on hold currently.    All the records are reviewed and case discussed with ED provider. Management plans discussed with the patient, family and they are in agreement.  CODE STATUS:  Full Code  TOTAL TIME TAKING CARE OF THIS PATIENT: 50 minutes.    Ashraf Mesta M.D on 05/21/2017 at 4:28 PM  Between 7am to 6pm - Pager - [phone]  After 6pm go to [email or web address] - password [email or web address]  [facility]  Office  [phone]  CC: Primary care physician; Veneda Melter, FNP

## 2017-05-21 NOTE — ED Triage Notes (Signed)
Patient arrived from home by EMS for fall. Shortening noted to  R leg. Patient also c/o back pain. HX dementia. Patient was at home pulling weeds, slipped and fell.

## 2017-05-21 NOTE — Progress Notes (Signed)
Admission: Patient alert and oriented X1-2. Patient not complaining of any pain at this time. Foley in place. Ice pack on hip. teds and foot pumps on. Patient lives at home alone with a caregiver, because of his dementia but can independently move per baseline. Patient sitting up and eating. Patient and family oriented to room and call bell system.  Harvie HeckMelanie Kylii Ennis, RN

## 2017-05-22 LAB — BASIC METABOLIC PANEL
ANION GAP: 9 (ref 5–15)
BUN: 25 mg/dL — ABNORMAL HIGH (ref 6–20)
CALCIUM: 8.9 mg/dL (ref 8.9–10.3)
CHLORIDE: 98 mmol/L — AB (ref 101–111)
CO2: 29 mmol/L (ref 22–32)
Creatinine, Ser: 0.94 mg/dL (ref 0.61–1.24)
GFR calc Af Amer: >60 mL/min (ref >60)
GFR calc non Af Amer: >60 mL/min (ref >60)
GLUCOSE: 148 mg/dL — AB (ref 65–99)
Potassium: 3.3 mmol/L — ABNORMAL LOW (ref 3.5–5.1)
Sodium: 136 mmol/L (ref 135–145)

## 2017-05-22 LAB — CBC
HCT: 33.7 % — ABNORMAL LOW (ref 40.0–52.0)
HEMOGLOBIN: 11.6 g/dL — AB (ref 13.0–18.0)
MCH: 32.8 pg (ref 26.0–34.0)
MCHC: 34.4 g/dL (ref 32.0–36.0)
MCV: 95.3 fL (ref 80.0–100.0)
Platelets: 200 10*3/uL (ref 150–440)
RBC: 3.54 MIL/uL — ABNORMAL LOW (ref 4.40–5.90)
RDW: 14.1 % (ref 11.5–14.5)
WBC: 10.2 10*3/uL (ref 3.8–10.6)

## 2017-05-22 LAB — GLUCOSE, CAPILLARY
GLUCOSE-CAPILLARY: 215 mg/dL — AB (ref 65–99)
GLUCOSE-CAPILLARY: 196 mg/dL — AB (ref 65–99)
Glucose-Capillary: 144 mg/dL — ABNORMAL HIGH (ref 65–99)
Glucose-Capillary: 148 mg/dL — ABNORMAL HIGH (ref 65–99)

## 2017-05-22 LAB — ABO/RH: ABO/RH(D): O POS

## 2017-05-22 LAB — HEMOGLOBIN A1C
HEMOGLOBIN A1C: 7.2 % — AB (ref 4.8–5.6)
Mean Plasma Glucose: 160 mg/dL

## 2017-05-22 LAB — PROTIME-INR
INR: 2.94
Prothrombin Time: 31.3 seconds — ABNORMAL HIGH (ref 11.4–15.2)

## 2017-05-22 LAB — MAGNESIUM: MAGNESIUM: 2 mg/dL (ref 1.7–2.4)

## 2017-05-22 MED ORDER — POTASSIUM CHLORIDE CRYS ER 20 MEQ PO TBCR
40.0000 meq | EXTENDED_RELEASE_TABLET | Freq: Once | ORAL | Status: AC
  Administered 2017-05-22: 40 meq via ORAL
  Filled 2017-05-22: qty 2

## 2017-05-22 MED ORDER — SODIUM CHLORIDE 0.9 % IV SOLN
INTRAVENOUS | Status: DC
  Administered 2017-05-22 – 2017-05-23 (×3): via INTRAVENOUS

## 2017-05-22 MED ORDER — PHYTONADIONE 5 MG PO TABS
5.0000 mg | ORAL_TABLET | Freq: Once | ORAL | Status: AC
  Administered 2017-05-22: 5 mg via ORAL
  Filled 2017-05-22: qty 1

## 2017-05-22 NOTE — Evaluation (Signed)
Clinical/Bedside Swallow Evaluation Patient Details  Name: Spencer Little MRN: [id number] Date of Birth: 08-06-1924  Today's Date: 05/22/2017 Time: SLP Start Time (ACUTE ONLY): 1055 SLP Stop Time (ACUTE ONLY): 1155 SLP Time Calculation (min) (ACUTE ONLY): 60 min  Past Medical History:  Past Medical History:  Diagnosis Date  . Atrial fibrillation (HCC)   . 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 Diabetes (HCC)   . Hypertension    Past Surgical History:  Past Surgical History:  Procedure Laterality Date  . CARDIAC CATHETERIZATION    . CHOLECYSTECTOMY     HPI:      Assessment / Plan / Recommendation Clinical Impression  Pt appears at increased risk for aspiration secondary to impact of goiter (baseline per Daughter) on the pharyngeal/esophageal phases of swallowing. Daughter described pt's baseline swallowing deficits for ~5-7 years to be that of frequent throat clearing and spitting of phlegm during oral intake/meals. Daughter stated pt's primary MD indicated the goiter could be increasing in size but that "it was not treatable". Pt has been eating an oral diet modifying the foods to make them "easier for swallowing" but that he continues to eat meats. Suspect meats would be more difficult to move around a more narrowed area (goiter) and could increase risk for choking/aspiration. This could have been the case this morning when pt attempted to swallow Pills Whole w/ NSG.  SLP Visit Diagnosis: Dysphagia, pharyngoesophageal phase (R13.14)    Aspiration Risk  Mild aspiration risk (-Moderate aspiration risk)    Diet Recommendation  Dysphagia level 2 w/ gravy; Thin liquids via CUP.  Strict aspiration and Reflux precautions; monitoring at meals.   Medication Administration: Crushed with puree (for easier clearing of the pharynx/esophagus)    Other  Recommendations Recommended Consults:  (Palliative Care consult) Oral Care Recommendations: Oral care  BID;Staff/trained caregiver to provide oral care Other Recommendations:  (TBD)   Follow up Recommendations Skilled Nursing facility      Frequency and Duration min 3x week  2 weeks       Prognosis Prognosis for Safe Diet Advancement: Fair Barriers to Reach Goals: Cognitive deficits;Severity of deficits (baseline dyspahgia; upcoming surgery) Barriers/Prognosis Comment: pt is due for R hip surgery which could further impact Cognitive status (baseline Dementia)      Swallow Study   General Date of Onset: 05/21/17 Type of Study: Bedside Swallow Evaluation Previous Swallow Assessment: none stated Diet Prior to this Study: Thin liquids;Regular ("soft foods") Temperature Spikes Noted: No (wbc 10.2) Respiratory Status: Room air History of Recent Intubation: No Behavior/Cognition: Alert;Cooperative;Pleasant mood;Confused;Distractible;Requires cueing (baseline Dementia) Oral Cavity Assessment: Within Functional Limits;Dry Oral Care Completed by SLP: Recent completion by staff Oral Cavity - Dentition: Missing dentition Vision: Functional for self-feeding Self-Feeding Abilities: Able to feed self;Needs assist;Needs set up;Total assist (weak) Patient Positioning: Upright in bed (repositioned) Baseline Vocal Quality: Normal;Low vocal intensity Volitional Cough: Strong Volitional Swallow: Able to elicit    Oral/Motor/Sensory Function Overall Oral Motor/Sensory Function: Within functional limits   Ice Chips Ice chips: Within functional limits Presentation: Spoon (fed; 3 trials) Other Comments: min throat clear noted x1   Thin Liquid Thin Liquid: Impaired Presentation: Cup;Self Fed (10 trials) Oral Phase Impairments:  (none) Oral Phase Functional Implications:  (none) Pharyngeal  Phase Impairments: Throat Clearing - Delayed (w/ spitting of phlegm - Daughter stated baseline) Other Comments: Daughter stated appeared like his baseline    Nectar Thick Nectar Thick Liquid: Not tested    Honey Thick Honey Thick  Liquid: Not tested   Puree Puree: Impaired Presentation: Spoon;Self Fed (assisted; 5 trials) Oral Phase Impairments:  (none) Oral Phase Functional Implications:  (none) Pharyngeal Phase Impairments: Throat Clearing - Delayed (similar to liquids)   Solid   GO   Solid: Impaired (mech soft trials) Presentation: Self Fed (x3 ) Oral Phase Impairments:  (none) Oral Phase Functional Implications:  (none) Pharyngeal Phase Impairments: Throat Clearing - Delayed (similar to liquids, puree) Other Comments: boluses were broken down and moistened well for easier pharyngeal/esophageal clearing d/t goiter(baseline)        Jerilynn SomKatherine Genny Caulder, MS, CCC-SLP Louanne Calvillo 05/22/2017,5:58 PM

## 2017-05-22 NOTE — Clinical Social Work Note (Signed)
Clinical Social Work Assessment  Patient Details  Name: Spencer Little MRN: [id number] Date of Birth: 16-Jul-1924  Date of referral:  05/22/17               Reason for consult:  Facility Placement                Permission sought to share information with:  Chartered certified accountant granted to share information::  Yes, Verbal Permission Granted  Name::      Spencer Little::   Spencer Little   Relationship::     Contact Information:     Housing/Transportation Living arrangements for the past 2 months:  [place] of Information:  Adult Children Patient Interpreter Needed:  None Criminal Activity/Legal Involvement Pertinent to Current Situation/Hospitalization:  No - Comment as needed Significant Relationships:  Adult Children, Spouse Lives with:  Spouse Do you feel safe going back to the place where you live?  Yes Need for family participation in patient care:  Yes (Comment)  Care giving concerns:  Patient lives in [place] with his wife Spencer Little.    Social Worker assessment / plan:  Holiday representative (CSW) reviewed chart and noted that patient will have surgery for a hip fracture. CSW met with patient and his daughter Spencer Little [phone] was at bedside. Patient was pleasantly confused and did not participate in assessment. Per daughter patient's wife Spencer Little was at [facility] about 1 month ago due to a hip fracture. Per daughter patient's wife is now at [facility] for short term rehab and will be discharging from [facility] to home in the next couple of days. CSW briefly explained SNF process as daughter is already familiar with it. Daughter prefers for patient to go to SNF for rehab in [place]. CSW provided SNF list to daughter. FL2 complete and faxed out. CSW will continue to follow and assist as needed.   Employment status:  Retired Nurse, adult PT  Recommendations:  Not assessed at this time Information / Referral to community resources:  [place]  Patient/Family's Response to care:  Patient's daughter is agreeable to [employer] in [place] [place].   Patient/Family's Understanding of and Emotional Response to Diagnosis, Current Treatment, and Prognosis:  Patient's daughter was very pleasant and thanked CSW for assistance.   Emotional Assessment Appearance:  Appears stated age Attitude/Demeanor/Rapport:    Affect (typically observed):  Accepting, Adaptable, Pleasant Orientation:  Oriented to Self, Fluctuating Orientation (Suspected and/or reported Sundowners) Alcohol / Substance use:  Not Applicable Psych involvement (Current and /or in the community):  No (Comment)  Discharge Needs  Concerns to be addressed:  Discharge Planning Concerns Readmission within the last 30 days:  No Current discharge risk:  Dependent with Mobility, Cognitively Impaired Barriers to Discharge:  Continued Medical Work up   [employer], Spencer Beets, LCSW 05/22/2017, 10:51 AM

## 2017-05-22 NOTE — NC FL2 (Signed)
[place] MEDICAID FL2 LEVEL OF CARE SCREENING TOOL     IDENTIFICATION  Patient Name: Spencer Little Birthdate: 04/01/1924 Sex: male Admission Date (Current Location): 05/21/2017  [place]   Facility and Address:  [facility], [address], [place], [place] [address]      Provider Number: [id number]  Attending Physician Name and Address:  Shaune Pollackhen, Rashad Auld, MD  Relative Name and Phone Number:       Current Level of Care: Hospital Recommended Level of Care: Skilled Nursing Facility Prior Approval Number:    Date Approved/Denied:   PASRR Number:  [id number])  Discharge Plan: SNF    Current Diagnoses: Patient Active Problem List   Diagnosis Date Noted  . Hip fracture (HCC) 05/21/2017    Orientation RESPIRATION BLADDER Height & Weight     Self  Normal Continent Weight: 165 lb (74.8 kg) Height:  6\' 1"  (185.4 cm)  BEHAVIORAL SYMPTOMS/MOOD NEUROLOGICAL BOWEL NUTRITION STATUS   (none)  (none) Continent Diet: DYS 2, thin liquids, meds crushed in apple sauce.   AMBULATORY STATUS COMMUNICATION OF NEEDS Skin   Extensive Assist Verbally Surgical wounds                       Personal Care Assistance Level of Assistance  Bathing, Feeding, Dressing Bathing Assistance: Limited assistance Feeding assistance: Independent Dressing Assistance: Limited assistance     Functional Limitations Info  Sight, Hearing, Speech Sight Info: Adequate Hearing Info: Impaired Speech Info: Adequate    SPECIAL CARE FACTORS FREQUENCY  PT (By licensed PT), OT (By licensed OT)     PT Frequency:  (5) OT Frequency:  (5)            Contractures      Additional Factors Info  Code Status, Allergies, Insulin Sliding Scale Code Status Info:  (Full Code. ) Allergies Info:  (Penicillins )   Insulin Sliding Scale Info:  (NovoLog Insulin Injections. )       Current Medications (05/22/2017):  This is the current hospital active  medication list Current Facility-Administered Medications  Medication Dose Route Frequency Provider Last Rate Last Dose  . acetaminophen (TYLENOL) tablet 650 mg  650 mg Oral Q6H PRN Enid BaasKalisetti, Radhika, MD   650 mg at 05/21/17 1842   Or  . acetaminophen (TYLENOL) suppository 650 mg  650 mg Rectal Q6H PRN Enid BaasKalisetti, Radhika, MD      . amLODipine (NORVASC) tablet 10 mg  10 mg Oral Daily Enid BaasKalisetti, Radhika, MD   10 mg at 05/22/17 [id number]  . clindamycin (CLEOCIN) IVPB 600 mg  600 mg Intravenous To OR Hooten, Illene LabradorJames P, MD      . donepezil (ARICEPT) tablet 10 mg  10 mg Oral QPM Enid BaasKalisetti, Radhika, MD   10 mg at 05/21/17 1842  . fentaNYL (SUBLIMAZE) injection 12.5 mcg  12.5 mcg Intravenous Q6H PRN Enid BaasKalisetti, Radhika, MD      . finasteride (PROSCAR) tablet 5 mg  5 mg Oral QPM Enid BaasKalisetti, Radhika, MD   5 mg at 05/21/17 1842  . glipiZIDE (GLUCOTROL) tablet 5 mg  5 mg Oral QAC supper Enid BaasKalisetti, Radhika, MD   5 mg at 05/21/17 2200  . HYDROcodone-acetaminophen (NORCO/VICODIN) 5-325 MG per tablet 1-2 tablet  1-2 tablet Oral Q4H PRN Enid BaasKalisetti, Radhika, MD   1 tablet at 05/22/17 0108  . insulin aspart (novoLOG) injection 0-5 Units  0-5 Units Subcutaneous QHS Enid BaasKalisetti, Radhika, MD      . insulin aspart (novoLOG) injection  0-9 Units  0-9 Units Subcutaneous TID WC Enid Baas, MD   1 Units at 05/22/17 [phone]  . lisinopril (PRINIVIL,ZESTRIL) tablet 20 mg  20 mg Oral Daily Enid Baas, MD   20 mg at 05/22/17 9604  . memantine (NAMENDA) tablet 10 mg  10 mg Oral BID Enid Baas, MD   10 mg at 05/22/17 5409  . ondansetron (ZOFRAN) tablet 4 mg  4 mg Oral Q6H PRN Enid Baas, MD       Or  . ondansetron (ZOFRAN) injection 4 mg  4 mg Intravenous Q6H PRN Enid Baas, MD      . phytonadione (VITAMIN K) tablet 5 mg  5 mg Oral Once Enid Baas, MD   Stopped at 05/21/17 1615  . simvastatin (ZOCOR) tablet 10 mg  10 mg Oral QPM Enid Baas, MD   10 mg at 05/21/17 1842     Discharge  Medications: Please see discharge summary for a list of discharge medications.  Relevant Imaging Results:  Relevant Lab Results:   Additional Information  (SSN: [id number])  Sample, Darleen Crocker, LCSW

## 2017-05-22 NOTE — Progress Notes (Signed)
Clinical Child psychotherapistocial Worker (CSW) presented bed offers to patient's daughter Spencer Little. Per daughter she will discuss offers with patient's wife and get back to CSW.   [employer] Hughes IncorporatedBailey Jermichael Bel[place], LCSW [phone]

## 2017-05-22 NOTE — Progress Notes (Signed)
[employer] - [place] at [facility]   PATIENT NAME: Spencer Little    MR#:  [id number]  DATE OF BIRTH:  1924/01/12  SUBJECTIVE:  CHIEF COMPLAINT:   Chief Complaint  Patient presents with  . Leg Injury    right   The patient is demented. INR is still therapeutic at 2.94 REVIEW OF SYSTEMS:  Review of Systems  Unable to perform ROS: Dementia    DRUG ALLERGIES:   Allergies  Allergen Reactions  . Penicillins    VITALS:  Blood pressure 127/75, pulse 63, temperature 98.3 F (36.8 C), temperature source Oral, resp. rate 16, height 6\' 1"  (1.854 m), weight 165 lb (74.8 kg), SpO2 100 %. PHYSICAL EXAMINATION:  Physical Exam  Constitutional: He is well-developed, well-nourished, and in no distress.  HENT:  Head: Normocephalic.  Eyes: Conjunctivae and EOM are normal. No scleral icterus.  Neck: Normal range of motion. Neck supple. No JVD present. No tracheal deviation present.  Cardiovascular: Normal rate, regular rhythm and normal heart sounds.  Exam reveals no gallop.   No murmur heard. Pulmonary/Chest: Effort normal and breath sounds normal. No respiratory distress. He has no wheezes. He has no rales.  Abdominal: Soft. Bowel sounds are normal. He exhibits no distension. There is no tenderness.  Neurological: He is alert.  Unable to exam.  Skin: No rash noted. No erythema.  Psychiatric:  Demented.   LABORATORY PANEL:  Male CBC  Recent Labs Lab 05/22/17 0444  WBC 10.2  HGB 11.6*  HCT 33.7*  PLT 200   ------------------------------------------------------------------------------------------------------------------ Chemistries   Recent Labs Lab 05/21/17 1348 05/22/17 0444  NA 139 136  K 4.8 3.3*  CL 103 98*  CO2 29 29  GLUCOSE 170* 148*  BUN 20 25*  CREATININE 0.88 0.94  CALCIUM 9.2 8.9  MG  --  2.0  AST 31  --   ALT 17  --   ALKPHOS 66  --   BILITOT 1.0  --    RADIOLOGY:  Ct Head Wo Contrast  Result Date: 05/21/2017 CLINICAL  DATA:  Fall history of dementia EXAM: CT HEAD WITHOUT CONTRAST CT CERVICAL SPINE WITHOUT CONTRAST TECHNIQUE: Multidetector CT imaging of the head and cervical spine was performed following the standard protocol without intravenous contrast. Multiplanar CT image reconstructions of the cervical spine were also generated. COMPARISON:  09/07/2015 FINDINGS: CT HEAD FINDINGS Brain: No acute territorial infarction, hemorrhage or intracranial mass is seen. Mild periventricular small vessel white matter ischemic changes. Moderate atrophy. Stable ventricle size and configuration. Vascular: No hyperdense vessels. Scattered calcifications at the carotid siphons. Skull: No skull fracture.  No suspicious bone lesion. Sinuses/Orbits: Mucosal thickening or retention cysts in the maxillary and ethmoid sinuses. No acute orbital abnormality. Bilateral lens extraction. Other: None CT CERVICAL SPINE FINDINGS Alignment: Exaggeration of the cervical lordosis. No subluxation. Facet alignment within normal limits. Skull base and vertebrae: Craniovertebral junction appears intact. There is no fracture visualized. Soft tissues and spinal canal: No prevertebral fluid or swelling. No visible canal hematoma. Disc levels: Multilevel diffuse moderate severe degenerative changes from C3 through T1. Multiple levels of foraminal stenosis and facet arthropathy. Upper chest: Lung apices clear. Multiple thyroid nodules measuring up to 2 cm. Mild carotid artery calcification. Other: None IMPRESSION: 1. No CT evidence for acute intracranial abnormality. 2. Exaggerated cervical lordosis.  No acute fracture seen 3. Multiple thyroid nodules. Nonemergent thyroid ultrasound follow-up as clinically indicated. Electronically Signed   By: Jasmine Pang M.D.   On: 05/21/2017 15:19  Ct Cervical Spine Wo Contrast  Result Date: 05/21/2017 CLINICAL DATA:  Fall history of dementia EXAM: CT HEAD WITHOUT CONTRAST CT CERVICAL SPINE WITHOUT CONTRAST TECHNIQUE:  Multidetector CT imaging of the head and cervical spine was performed following the standard protocol without intravenous contrast. Multiplanar CT image reconstructions of the cervical spine were also generated. COMPARISON:  09/07/2015 FINDINGS: CT HEAD FINDINGS Brain: No acute territorial infarction, hemorrhage or intracranial mass is seen. Mild periventricular small vessel white matter ischemic changes. Moderate atrophy. Stable ventricle size and configuration. Vascular: No hyperdense vessels. Scattered calcifications at the carotid siphons. Skull: No skull fracture.  No suspicious bone lesion. Sinuses/Orbits: Mucosal thickening or retention cysts in the maxillary and ethmoid sinuses. No acute orbital abnormality. Bilateral lens extraction. Other: None CT CERVICAL SPINE FINDINGS Alignment: Exaggeration of the cervical lordosis. No subluxation. Facet alignment within normal limits. Skull base and vertebrae: Craniovertebral junction appears intact. There is no fracture visualized. Soft tissues and spinal canal: No prevertebral fluid or swelling. No visible canal hematoma. Disc levels: Multilevel diffuse moderate severe degenerative changes from C3 through T1. Multiple levels of foraminal stenosis and facet arthropathy. Upper chest: Lung apices clear. Multiple thyroid nodules measuring up to 2 cm. Mild carotid artery calcification. Other: None IMPRESSION: 1. No CT evidence for acute intracranial abnormality. 2. Exaggerated cervical lordosis.  No acute fracture seen 3. Multiple thyroid nodules. Nonemergent thyroid ultrasound follow-up as clinically indicated. Electronically Signed   By: Jasmine Pang M.D.   On: 05/21/2017 15:19   Dg Hip Unilat W Or Wo Pelvis 2-3 Views Right  Result Date: 05/21/2017 CLINICAL DATA:  Fall at home today with right hip pain. EXAM: DG HIP (WITH OR WITHOUT PELVIS) 2-3V RIGHT COMPARISON:  None. FINDINGS: There is generalized osteopenia. There are mild degenerative changes of the hips.  There is a displaced trochanteric fracture of the right hip. There is mild displacement of the lesser trochanter. Degenerative change of the spine. IMPRESSION: Displaced right trochanteric fracture. Electronically Signed   By: Elberta Fortis M.D.   On: 05/21/2017 15:28   ASSESSMENT AND PLAN:   Spencer Little  is a 83 y.o. male with a known history of Atrial fibrillation on Coumadin, moderate to severe dementia due to Alzheimer's and vascular dementia, non-insulin-dependent diabetes mellitus presents to the hospital secondary to a fall and right hip fracture.  #1 Right hip fracture- due to mechanical fall - orthopedics consulted, NPO after midnight - moderate risk for non cardiac surgery. INR was 2.8 yesterday Given 1 dose of oral Vit K. . INR at 2.94 today and check INR in AM, can use  FFP if needed prior to surgery if INR still >1.5 - Pain management. Physical therapy and resume Coumadin after the surgery.  #2 hypertension-continue lisinopril, Norvasc. Hold Lasix.   #3 diabetes mellitus-hold glipizide. On sliding scale insulin.  #4 dementia-follows with neurology as outpatient. Continue Aricept and Namenda. Conversational and oriented to self at baseline.  #5 DVT prophylaxis-will be started after surgery  #6 atrial fibrillation-chronic, stable. Heart rate on the lower side, so not on any rate limiting medications. -Coumadin on hold currently.  Hypokalemia. Give potassium supplement and a follow-up level. Magnesium is normal.  All the records are reviewed and case discussed with Care Management/Social Worker. Management plans discussed with the patient, family and they are in agreement.  CODE STATUS: Full Code  TOTAL TIME TAKING CARE OF THIS PATIENT: 35 minutes.   More than 50% of the time was spent in counseling/coordination of care: YES  POSSIBLE D/C IN 3 DAYS, DEPENDING ON CLINICAL CONDITION.   Shaune Pollackhen, Spencer Little M.D on 05/22/2017 at 2:41 PM  Between 7am to 6pm - Pager -  [phone]  After 6pm go to [email or web address] - Scientist, research (life sciences)password [email or web address] [email or web address]  [facility] [place] [facility]  Office  [phone]  CC: Primary care physician; Veneda MelterWhite, Christina M, FNP  Note: This dictation was prepared with Dragon dictation along with smaller phrase technology. Any transcriptional errors that result from this process are unintentional.

## 2017-05-22 NOTE — Progress Notes (Signed)
Foley removed due to leakage.

## 2017-05-22 NOTE — Progress Notes (Signed)
PT Cancellation Note  Patient Details Name: Francina Amesurell C Gwinn MRN: [id number] DOB: 1924/04/10   Cancelled Treatment:    Reason Eval/Treat Not Completed: Medical issues which prohibited therapy (Per chart review, patient scheduled for surgical repair of hip fracture on 05/23/17.  Will require new orders to initiate PT post-op.  Please re-consult as medically appropriate.)   Brand Siever H. Manson PasseyBrown, PT, DPT, NCS 05/22/17, 10:05 PM [phone]

## 2017-05-22 NOTE — Progress Notes (Signed)
Patient is A&Ox1. SLP eval this shift, aspiration precautions placed, Dys 2 diet. Skin very dry and ecchmoytic. Foley in place, very little output. MD notified (Hooten), new order for NS & 2975ml/hr. Per MD tentative surgery on 05/23/17 afternoon once Labs are satisfactory. NPO at midnight

## 2017-05-22 NOTE — Clinical Social Work Placement (Signed)
   CLINICAL SOCIAL WORK PLACEMENT  NOTE  Date:  05/22/2017  Patient Details  Name: Spencer Little MRN: [id number] Date of Birth: 25-Nov-1924  Clinical Social Work is seeking post-discharge placement for this patient at the Skilled  Nursing Facility level of care (*CSW will initial, date and re-position this form in  chart as items are completed):  Yes   Patient/family provided with [facility] Clinical Social Work Department's list of facilities offering this level of care within the geographic area requested by the patient (or if unable, by the patient's family).  Yes   Patient/family informed of their freedom to choose among providers that offer the needed level of care, that participate in Medicare, Medicaid or managed care program needed by the patient, have an available bed and are willing to accept the patient.  Yes   Patient/family informed of [facility]'s ownership interest in [facility] and [facility], as well as of the fact that they are under no obligation to receive care at these facilities.  PASRR submitted to EDS on 05/22/17     PASRR number received on 05/22/17     Existing PASRR number confirmed on       FL2 transmitted to all facilities in geographic area requested by pt/family on 05/22/17     FL2 transmitted to all facilities within larger geographic area on       Patient informed that his/her managed care company has contracts with or will negotiate with certain facilities, including the following:            Patient/family informed of bed offers received.  Patient chooses bed at       Physician recommends and patient chooses bed at      Patient to be transferred to   on  .  Patient to be transferred to facility by       Patient family notified on   of transfer.  Name of family member notified:        PHYSICIAN       Additional Comment:    _______________________________________________ Allante Beane, Darleen CrockerBailey M, LCSW 05/22/2017, 10:50 AM

## 2017-05-23 ENCOUNTER — Inpatient Hospital Stay: Admit: 2017-05-23 | Payer: Medicare Other

## 2017-05-23 ENCOUNTER — Inpatient Hospital Stay (HOSPITAL_COMMUNITY)
Admit: 2017-05-23 | Discharge: 2017-05-23 | Disposition: A | Discharge status: Home / Self Care | Payer: Medicare Other | Attending: Internal Medicine | Admitting: Internal Medicine

## 2017-05-23 DIAGNOSIS — L8990 Pressure ulcer of unspecified site, unspecified stage: Secondary | ICD-10-CM | POA: Insufficient documentation

## 2017-05-23 DIAGNOSIS — I340 Nonrheumatic mitral (valve) insufficiency: Secondary | ICD-10-CM

## 2017-05-23 LAB — GLUCOSE, CAPILLARY
GLUCOSE-CAPILLARY: 175 mg/dL — AB (ref 65–99)
GLUCOSE-CAPILLARY: 195 mg/dL — AB (ref 65–99)
GLUCOSE-CAPILLARY: 111 mg/dL — AB (ref 65–99)
Glucose-Capillary: 143 mg/dL — ABNORMAL HIGH (ref 65–99)
Glucose-Capillary: 110 mg/dL — ABNORMAL HIGH (ref 65–99)

## 2017-05-23 LAB — BASIC METABOLIC PANEL
Anion gap: 8 (ref 5–15)
BUN: 27 mg/dL — ABNORMAL HIGH (ref 6–20)
CALCIUM: 9.1 mg/dL (ref 8.9–10.3)
CO2: 29 mmol/L (ref 22–32)
CREATININE: 0.76 mg/dL (ref 0.61–1.24)
Chloride: 100 mmol/L — ABNORMAL LOW (ref 101–111)
GFR calc non Af Amer: >60 mL/min (ref >60)
Glucose, Bld: 203 mg/dL — ABNORMAL HIGH (ref 65–99)
Potassium: 4.3 mmol/L (ref 3.5–5.1)
SODIUM: 137 mmol/L (ref 135–145)

## 2017-05-23 LAB — PROTIME-INR
INR: 1.97
INR: 1.73
PROTHROMBIN TIME: 22.7 s — AB (ref 11.4–15.2)
Prothrombin Time: 20.5 seconds — ABNORMAL HIGH (ref 11.4–15.2)

## 2017-05-23 MED ORDER — SODIUM CHLORIDE 0.9 % IV SOLN
Freq: Once | INTRAVENOUS | Status: AC
  Administered 2017-05-24: 12:00:00 via INTRAVENOUS

## 2017-05-23 MED ORDER — PHYTONADIONE 5 MG PO TABS
5.0000 mg | ORAL_TABLET | ORAL | Status: AC
  Administered 2017-05-23: 5 mg via ORAL
  Filled 2017-05-23: qty 1

## 2017-05-23 MED ORDER — MUPIROCIN 2 % EX OINT
TOPICAL_OINTMENT | Freq: Two times a day (BID) | CUTANEOUS | Status: DC
  Administered 2017-05-23 (×2): via NASAL
  Administered 2017-05-24: 1 via NASAL
  Administered 2017-05-24 – 2017-05-27 (×6): via NASAL
  Administered 2017-05-27 – 2017-05-28 (×2): 1 via NASAL
  Filled 2017-05-23: qty 22

## 2017-05-23 NOTE — Progress Notes (Signed)
Pt 5 runs v tach, pt asymptomatic. VSS. MD notified. no new orders. Will continue to monitor

## 2017-05-23 NOTE — Progress Notes (Signed)
[employer] - [place] at [facility]   PATIENT NAME: Spencer Little    MR#:  [id number]  DATE OF BIRTH:  08-21-1924  SUBJECTIVE:  CHIEF COMPLAINT:   Chief Complaint  Patient presents with  . Leg Injury    right   - Admitted for hip fracture, INR is elevated still. Received vitamin K yesterday. Patient is confused and has dementia at baseline. -Family is at bedside -Low-grade fever this morning  REVIEW OF SYSTEMS:  Review of Systems  Unable to perform ROS: Dementia    DRUG ALLERGIES:   Allergies  Allergen Reactions  . Penicillins     VITALS:  Blood pressure (!) 146/71, pulse 88, temperature 98.2 F (36.8 C), temperature source Oral, resp. rate 18, height 6\' 1"  (1.854 m), weight 74.8 kg (165 lb), SpO2 98 %.  PHYSICAL EXAMINATION:  Physical Exam  GENERAL:  83 y.o.-year-old patient lying in the bed with no acute distress. Very talkative EYES: Pupils equal, round, reactive to light and accommodation. No scleral icterus. Extraocular muscles intact.  HEENT: Head atraumatic, normocephalic. Oropharynx and nasopharynx clear. Dry mucus membranes NECK:  Supple, no jugular venous distention. No thyroid enlargement, no tenderness.  LUNGS: Normal breath sounds bilaterally, no wheezing, rales,rhonchi or crepitation. No use of accessory muscles of respiration. Decreased bibasilar breath sounds CARDIOVASCULAR: S1, S2 normal. No  rubs, or gallops. 2/6 systolic murmur present ABDOMEN: Soft, nontender, nondistended. Bowel sounds present. No organomegaly or mass.  EXTREMITIES: No pedal edema, cyanosis, or clubbing.  NEUROLOGIC: Cranial nerves II through XII are intact. Muscle strength 5/5 in all extremities except right leg due to fracture. Sensation intact. Gait not checked.  PSYCHIATRIC: The patient is alert and oriented to self, no short term memory, confused.  SKIN: No obvious rash, lesion, or ulcer.    LABORATORY PANEL:   CBC  Recent Labs Lab 05/22/17 0444    WBC 10.2  HGB 11.6*  HCT 33.7*  PLT 200   ------------------------------------------------------------------------------------------------------------------  Chemistries   Recent Labs Lab 05/21/17 1348 05/22/17 0444 05/23/17 0528  NA 139 136 137  K 4.8 3.3* 4.3  CL 103 98* 100*  CO2 29 29 29   GLUCOSE 170* 148* 203*  BUN 20 25* 27*  CREATININE 0.88 0.94 0.76  CALCIUM 9.2 8.9 9.1  MG  --  2.0  --   AST 31  --   --   ALT 17  --   --   ALKPHOS 66  --   --   BILITOT 1.0  --   --    ------------------------------------------------------------------------------------------------------------------  Cardiac Enzymes No results for input(s): TROPONINI in the last 168 hours. ------------------------------------------------------------------------------------------------------------------  RADIOLOGY:  Ct Head Wo Contrast  Result Date: 05/21/2017 CLINICAL DATA:  Fall history of dementia EXAM: CT HEAD WITHOUT CONTRAST CT CERVICAL SPINE WITHOUT CONTRAST TECHNIQUE: Multidetector CT imaging of the head and cervical spine was performed following the standard protocol without intravenous contrast. Multiplanar CT image reconstructions of the cervical spine were also generated. COMPARISON:  09/07/2015 FINDINGS: CT HEAD FINDINGS Brain: No acute territorial infarction, hemorrhage or intracranial mass is seen. Mild periventricular small vessel white matter ischemic changes. Moderate atrophy. Stable ventricle size and configuration. Vascular: No hyperdense vessels. Scattered calcifications at the carotid siphons. Skull: No skull fracture.  No suspicious bone lesion. Sinuses/Orbits: Mucosal thickening or retention cysts in the maxillary and ethmoid sinuses. No acute orbital abnormality. Bilateral lens extraction. Other: None CT CERVICAL SPINE FINDINGS Alignment: Exaggeration of the cervical lordosis. No subluxation. Facet alignment within normal  limits. Skull base and vertebrae: Craniovertebral junction  appears intact. There is no fracture visualized. Soft tissues and spinal canal: No prevertebral fluid or swelling. No visible canal hematoma. Disc levels: Multilevel diffuse moderate severe degenerative changes from C3 through T1. Multiple levels of foraminal stenosis and facet arthropathy. Upper chest: Lung apices clear. Multiple thyroid nodules measuring up to 2 cm. Mild carotid artery calcification. Other: None IMPRESSION: 1. No CT evidence for acute intracranial abnormality. 2. Exaggerated cervical lordosis.  No acute fracture seen 3. Multiple thyroid nodules. Nonemergent thyroid ultrasound follow-up as clinically indicated. Electronically Signed   By: Jasmine PangKim  Fujinaga M.D.   On: 05/21/2017 15:19   Ct Cervical Spine Wo Contrast  Result Date: 05/21/2017 CLINICAL DATA:  Fall history of dementia EXAM: CT HEAD WITHOUT CONTRAST CT CERVICAL SPINE WITHOUT CONTRAST TECHNIQUE: Multidetector CT imaging of the head and cervical spine was performed following the standard protocol without intravenous contrast. Multiplanar CT image reconstructions of the cervical spine were also generated. COMPARISON:  09/07/2015 FINDINGS: CT HEAD FINDINGS Brain: No acute territorial infarction, hemorrhage or intracranial mass is seen. Mild periventricular small vessel white matter ischemic changes. Moderate atrophy. Stable ventricle size and configuration. Vascular: No hyperdense vessels. Scattered calcifications at the carotid siphons. Skull: No skull fracture.  No suspicious bone lesion. Sinuses/Orbits: Mucosal thickening or retention cysts in the maxillary and ethmoid sinuses. No acute orbital abnormality. Bilateral lens extraction. Other: None CT CERVICAL SPINE FINDINGS Alignment: Exaggeration of the cervical lordosis. No subluxation. Facet alignment within normal limits. Skull base and vertebrae: Craniovertebral junction appears intact. There is no fracture visualized. Soft tissues and spinal canal: No prevertebral fluid or swelling. No  visible canal hematoma. Disc levels: Multilevel diffuse moderate severe degenerative changes from C3 through T1. Multiple levels of foraminal stenosis and facet arthropathy. Upper chest: Lung apices clear. Multiple thyroid nodules measuring up to 2 cm. Mild carotid artery calcification. Other: None IMPRESSION: 1. No CT evidence for acute intracranial abnormality. 2. Exaggerated cervical lordosis.  No acute fracture seen 3. Multiple thyroid nodules. Nonemergent thyroid ultrasound follow-up as clinically indicated. Electronically Signed   By: Jasmine PangKim  Fujinaga M.D.   On: 05/21/2017 15:19   Dg Hip Unilat W Or Wo Pelvis 2-3 Views Right  Result Date: 05/21/2017 CLINICAL DATA:  Fall at home today with right hip pain. EXAM: DG HIP (WITH OR WITHOUT PELVIS) 2-3V RIGHT COMPARISON:  None. FINDINGS: There is generalized osteopenia. There are mild degenerative changes of the hips. There is a displaced trochanteric fracture of the right hip. There is mild displacement of the lesser trochanter. Degenerative change of the spine. IMPRESSION: Displaced right trochanteric fracture. Electronically Signed   By: Elberta Fortisaniel  Boyle M.D.   On: 05/21/2017 15:28    EKG:   Orders placed or performed during the hospital encounter of 05/21/17  . EKG 12-Lead  . EKG 12-Lead  . ED EKG  . ED EKG    ASSESSMENT AND PLAN:   Ruthell Rummageurell Handa  is a 83 y.o. male with a known history of Atrial fibrillation on Coumadin, moderate to severe dementia due to Alzheimer's and vascular dementia, non-insulin-dependent diabetes mellitus presents to the hospital secondary to a fall and right hip fracture.  #1 Right hip fracture- due to mechanical fall - orthopedics consulted, patient was on Coumadin at home, INR still at 1.9 after vitamin K. FFP ordered for today and repeat INR this afternoon -Moderate cardiac risk for noncardiac surgery at this time. - Pain management. Physical therapy after the surgery. -Start incentive spirometry, patient  had a  low-grade temperature this morning. Urine analysis was normal on admission. -Currently on a dysphagia level II diet based on speech therapy recommendations. Monitor closely to prevent aspiration  #2 hypertension-continue lisinopril, Norvasc. Hold Lasix.   #3 diabetes mellitus- on sliding scale insulin.  #4 dementia-follows with neurology as outpatient. Continue Aricept and Namenda. Conversational and oriented to self at baseline.  #5 atrial fibrillation-chronic, stable. Heart rate on the lower side, so not on any rate limiting medications. -Coumadin on hold currently.  #6 DVT prophylaxis-possible surgery today      All the records are reviewed and case discussed with Care Management/Social Workerr. Management plans discussed with the patient, family and they are in agreement.  CODE STATUS: Full code  TOTAL TIME TAKING CARE OF THIS PATIENT: 38 minutes.   POSSIBLE D/C IN 2-3 DAYS, DEPENDING ON CLINICAL CONDITION.   Caniya Tagle M.D on 05/23/2017 at 1:16 PM  Between 7am to 6pm - Pager - [phone]  After 6pm go to [email or web address] - Social research officer, government  [facility] [place] [facility]  Office  [phone]  CC: Primary care physician; Veneda Melter, FNP

## 2017-05-23 NOTE — Progress Notes (Signed)
Called Dr. Ernest PineHooten regarding INR of 1.73. Stated no surgery today, may allow him to eat. Will come to unit to speak with family.

## 2017-05-23 NOTE — Care Management (Signed)
RNCM met with patient and his daughter to discuss discharge planning. They anticipate patient going to [facility] and CSW is also following. Up until this time patient is independent from home with his wife. He fell in the yard per daughter. He has a walker available for use at home. They would like [facility] for home health when he returns home. I have updated Tim with [facility]. RNCM will continue to follow.

## 2017-05-23 NOTE — Progress Notes (Signed)
Patient AxOx1. Increased frequency of confusion this day. Surgery not performed this day d/t results of labs. New consent placed in chart, Dr. Rosita KeaMenz tentatively will perform surgery @11 :30am on 05/24/17. Daughter is aware. Kallisetti also aware of plan. Patient continues to refuse to be repositioned, skin integrity continues to be compromised.

## 2017-05-23 NOTE — Progress Notes (Signed)
Patient refused to be repositioned on a 2 hour basis on 05/22/17, continued to encourage fluid intake and reposition.

## 2017-05-23 NOTE — Progress Notes (Signed)
SLP Cancellation Note  Patient Details Name: Spencer Little MRN: [id number] DOB: 06/18/1924   Cancelled treatment:       Reason Eval/Treat Not Completed: Patient at procedure or test/unavailable (Pt is NPO for surgery. ST services will f/u tomorrow). Recommend continued strict aspiration precautions w/ supervision/monitoring w/ any oral intake post surgery when pt may be drowsy/sleepy.    Jerilynn SomKatherine Watson, MS, CCC-SLP Watson,Katherine 05/23/2017, 9:28 AM

## 2017-05-23 NOTE — Progress Notes (Signed)
Clinical Education officer, museum (CSW) met with patient's daughter Spencer Little this morning at bedside to get SNF choice. Daughter reported that she spoke with patient's wife last night and they chose [facility]. Plan is for patient to have surgery and D/C to [facility] when medically stable. [facility] admissions coordinator at [facility] is aware of above. CSW will continue to follow and assist as needed.   [employer], LCSW [phone]

## 2017-05-23 NOTE — Progress Notes (Signed)
Patient seen and discussed with daughter.  Plan ORIF tomorrow if INR comes down enough.

## 2017-05-24 ENCOUNTER — Encounter: Payer: Self-pay | Admitting: Certified Registered Nurse Anesthetist

## 2017-05-24 ENCOUNTER — Encounter
Admission: EM | Disposition: A | Discharge status: Skilled Nursing Facility | Payer: Self-pay | Source: Home / Self Care | Attending: Internal Medicine

## 2017-05-24 ENCOUNTER — Inpatient Hospital Stay: Payer: Medicare Other

## 2017-05-24 ENCOUNTER — Inpatient Hospital Stay: Payer: Medicare Other | Admitting: Certified Registered Nurse Anesthetist

## 2017-05-24 HISTORY — PX: FEMUR IM NAIL: SHX1597

## 2017-05-24 LAB — BASIC METABOLIC PANEL
Anion gap: 9 (ref 5–15)
BUN: 31 mg/dL — ABNORMAL HIGH (ref 6–20)
CALCIUM: 9.1 mg/dL (ref 8.9–10.3)
CO2: 27 mmol/L (ref 22–32)
CREATININE: 0.88 mg/dL (ref 0.61–1.24)
Chloride: 106 mmol/L (ref 101–111)
GFR calc non Af Amer: >60 mL/min (ref >60)
Glucose, Bld: 141 mg/dL — ABNORMAL HIGH (ref 65–99)
Potassium: 3.9 mmol/L (ref 3.5–5.1)
Sodium: 142 mmol/L (ref 135–145)

## 2017-05-24 LAB — GLUCOSE, CAPILLARY
GLUCOSE-CAPILLARY: 131 mg/dL — AB (ref 65–99)
Glucose-Capillary: 140 mg/dL — ABNORMAL HIGH (ref 65–99)
Glucose-Capillary: 137 mg/dL — ABNORMAL HIGH (ref 65–99)

## 2017-05-24 LAB — ECHOCARDIOGRAM COMPLETE
Height: 73 in
WEIGHTICAEL: 2640 [oz_av]

## 2017-05-24 LAB — CBC
HCT: 30.4 % — ABNORMAL LOW (ref 40.0–52.0)
Hemoglobin: 10.3 g/dL — ABNORMAL LOW (ref 13.0–18.0)
MCH: 31.7 pg (ref 26.0–34.0)
MCHC: 33.8 g/dL (ref 32.0–36.0)
MCV: 93.9 fL (ref 80.0–100.0)
Platelets: 181 10*3/uL (ref 150–440)
RBC: 3.23 MIL/uL — ABNORMAL LOW (ref 4.40–5.90)
RDW: 14 % (ref 11.5–14.5)
WBC: 13.1 10*3/uL — ABNORMAL HIGH (ref 3.8–10.6)

## 2017-05-24 LAB — PROTIME-INR
INR: 1.48
PROTHROMBIN TIME: 18.1 s — AB (ref 11.4–15.2)

## 2017-05-24 SURGERY — INSERTION, INTRAMEDULLARY ROD, FEMUR
Anesthesia: Spinal

## 2017-05-24 SURGERY — FIXATION, FRACTURE, INTERTROCHANTERIC, WITH INTRAMEDULLARY ROD
Anesthesia: Choice | Laterality: Right

## 2017-05-24 MED ORDER — PHENYLEPHRINE HCL 10 MG/ML IJ SOLN
INTRAMUSCULAR | Status: AC
  Filled 2017-05-24: qty 1

## 2017-05-24 MED ORDER — CLINDAMYCIN PHOSPHATE 900 MG/50ML IV SOLN
INTRAVENOUS | Status: AC
  Filled 2017-05-24: qty 50

## 2017-05-24 MED ORDER — CLINDAMYCIN PHOSPHATE 900 MG/50ML IV SOLN
900.0000 mg | Freq: Once | INTRAVENOUS | Status: AC
Start: 2017-05-24 — End: 2017-05-24
  Administered 2017-05-24: 900 mg via INTRAVENOUS

## 2017-05-24 MED ORDER — LIDOCAINE HCL 2 % EX GEL
CUTANEOUS | Status: AC
  Filled 2017-05-24: qty 10

## 2017-05-24 MED ORDER — KETAMINE HCL 50 MG/ML IJ SOLN
INTRAMUSCULAR | Status: DC | PRN
  Administered 2017-05-24: 25 mg via INTRAMUSCULAR

## 2017-05-24 MED ORDER — EPHEDRINE SULFATE 50 MG/ML IJ SOLN
INTRAMUSCULAR | Status: AC
  Filled 2017-05-24: qty 1

## 2017-05-24 MED ORDER — SODIUM CHLORIDE 0.9 % IV SOLN
INTRAVENOUS | Status: DC
  Administered 2017-05-24 – 2017-05-26 (×4): via INTRAVENOUS

## 2017-05-24 MED ORDER — MENTHOL 3 MG MT LOZG
1.0000 | LOZENGE | OROMUCOSAL | Status: DC | PRN
  Filled 2017-05-24: qty 9

## 2017-05-24 MED ORDER — SODIUM CHLORIDE 0.9 % IJ SOLN
INTRAMUSCULAR | Status: AC
  Filled 2017-05-24: qty 10

## 2017-05-24 MED ORDER — METOCLOPRAMIDE HCL 5 MG/ML IJ SOLN: 5.0000 mg | Freq: Three times a day (TID) | INTRAMUSCULAR | Status: DC | PRN

## 2017-05-24 MED ORDER — BUPIVACAINE HCL (PF) 0.5 % IJ SOLN
INTRAMUSCULAR | Status: AC
  Filled 2017-05-24: qty 10

## 2017-05-24 MED ORDER — DOCUSATE SODIUM 100 MG PO CAPS
100.0000 mg | ORAL_CAPSULE | Freq: Two times a day (BID) | ORAL | Status: DC
  Filled 2017-05-24 (×2): qty 1

## 2017-05-24 MED ORDER — ENOXAPARIN SODIUM 40 MG/0.4ML ~~LOC~~ SOLN
40.0000 mg | SUBCUTANEOUS | Status: DC
  Administered 2017-05-25 – 2017-05-26 (×2): 40 mg via SUBCUTANEOUS
  Filled 2017-05-24 (×2): qty 0.4

## 2017-05-24 MED ORDER — ROCURONIUM BROMIDE 50 MG/5ML IV SOLN
INTRAVENOUS | Status: AC
  Filled 2017-05-24: qty 1

## 2017-05-24 MED ORDER — PROPOFOL 10 MG/ML IV BOLUS: INTRAVENOUS | Status: DC | PRN

## 2017-05-24 MED ORDER — ALUM & MAG HYDROXIDE-SIMETH 200-200-20 MG/5ML PO SUSP: 30.0000 mL | ORAL | Status: DC | PRN

## 2017-05-24 MED ORDER — MAGNESIUM HYDROXIDE 400 MG/5ML PO SUSP: 30.0000 mL | Freq: Every day | ORAL | Status: DC | PRN

## 2017-05-24 MED ORDER — LIDOCAINE HCL (PF) 2 % IJ SOLN
INTRAMUSCULAR | Status: AC
  Filled 2017-05-24: qty 2

## 2017-05-24 MED ORDER — ONDANSETRON HCL 4 MG/2ML IJ SOLN: 4.0000 mg | Freq: Once | INTRAMUSCULAR | Status: DC | PRN

## 2017-05-24 MED ORDER — SUCCINYLCHOLINE CHLORIDE 20 MG/ML IJ SOLN
INTRAMUSCULAR | Status: AC
  Filled 2017-05-24: qty 1

## 2017-05-24 MED ORDER — FENTANYL CITRATE (PF) 100 MCG/2ML IJ SOLN: 25.0000 ug | INTRAMUSCULAR | Status: DC | PRN

## 2017-05-24 MED ORDER — METOCLOPRAMIDE HCL 10 MG PO TABS: 5.0000 mg | ORAL_TABLET | Freq: Three times a day (TID) | ORAL | Status: DC | PRN

## 2017-05-24 MED ORDER — HEMOSTATIC AGENTS (NO CHARGE) OPTIME
TOPICAL | Status: DC | PRN
  Administered 2017-05-24: 1 via TOPICAL

## 2017-05-24 MED ORDER — BISACODYL 10 MG RE SUPP: 10.0000 mg | Freq: Every day | RECTAL | Status: DC | PRN

## 2017-05-24 MED ORDER — SODIUM CHLORIDE FLUSH 0.9 % IV SOLN
INTRAVENOUS | Status: AC
  Filled 2017-05-24: qty 10

## 2017-05-24 MED ORDER — PROPOFOL 10 MG/ML IV BOLUS
INTRAVENOUS | Status: AC
  Filled 2017-05-24: qty 20

## 2017-05-24 MED ORDER — PHYTONADIONE 5 MG PO TABS
5.0000 mg | ORAL_TABLET | Freq: Once | ORAL | Status: AC
  Administered 2017-05-24: 5 mg via ORAL
  Filled 2017-05-24: qty 1

## 2017-05-24 MED ORDER — 0.9 % SODIUM CHLORIDE (POUR BTL) OPTIME
TOPICAL | Status: DC | PRN
  Administered 2017-05-24: 500 mL

## 2017-05-24 MED ORDER — PROPOFOL 500 MG/50ML IV EMUL
INTRAVENOUS | Status: DC | PRN
  Administered 2017-05-24 (×6): 20 ug via INTRAVENOUS

## 2017-05-24 MED ORDER — SODIUM CHLORIDE 0.9 % IJ SOLN
INTRAMUSCULAR | Status: AC
  Administered 2017-05-24: 10 mL
  Filled 2017-05-24: qty 10

## 2017-05-24 MED ORDER — PHENOL 1.4 % MT LIQD
1.0000 | OROMUCOSAL | Status: DC | PRN
  Filled 2017-05-24: qty 177

## 2017-05-24 MED ORDER — KETAMINE HCL 50 MG/ML IJ SOLN
INTRAMUSCULAR | Status: AC
  Filled 2017-05-24: qty 10

## 2017-05-24 MED ORDER — MAGNESIUM CITRATE PO SOLN
1.0000 | Freq: Once | ORAL | Status: DC | PRN
  Filled 2017-05-24: qty 296

## 2017-05-24 MED ORDER — EPHEDRINE SULFATE 50 MG/ML IJ SOLN
5.0000 mg | Freq: Once | INTRAMUSCULAR | Status: AC
  Administered 2017-05-24: 5 mg via INTRAVENOUS

## 2017-05-24 MED ORDER — NEOMYCIN-POLYMYXIN B GU 40-200000 IR SOLN
Status: DC | PRN
  Administered 2017-05-24: 2 mL

## 2017-05-24 MED ORDER — BUPIVACAINE HCL (PF) 0.5 % IJ SOLN
INTRAMUSCULAR | Status: DC | PRN
  Administered 2017-05-24: 2.5 mL

## 2017-05-24 MED ORDER — MORPHINE SULFATE (PF) 2 MG/ML IV SOLN: 0.5000 mg | INTRAVENOUS | Status: DC | PRN

## 2017-05-24 MED ORDER — CLINDAMYCIN PHOSPHATE 900 MG/50ML IV SOLN
900.0000 mg | Freq: Four times a day (QID) | INTRAVENOUS | Status: AC
  Administered 2017-05-24 – 2017-05-25 (×3): 900 mg via INTRAVENOUS
  Filled 2017-05-24 (×4): qty 50

## 2017-05-24 MED ORDER — EPHEDRINE SULFATE 50 MG/ML IJ SOLN
INTRAMUSCULAR | Status: AC
Start: 2017-05-24 — End: 2017-05-24
  Administered 2017-05-24: 5 mg via INTRAVENOUS
  Filled 2017-05-24: qty 1

## 2017-05-24 SURGICAL SUPPLY — 30 items
CANISTER SUCT 1200ML W/VALVE (MISCELLANEOUS) ×3 IMPLANT
DRAPE C-ARMOR (DRAPES) ×3 IMPLANT
DRAPE SHEET LG 3/4 BI-LAMINATE (DRAPES) ×3 IMPLANT
DRAPE TABLE BACK 80X90 (DRAPES) ×3 IMPLANT
DRSG DERMACEA 8X12 NADH (GAUZE/BANDAGES/DRESSINGS) ×3 IMPLANT
DRSG OPSITE POSTOP 3X4 (GAUZE/BANDAGES/DRESSINGS) ×3 IMPLANT
DRSG OPSITE POSTOP 4X12 (GAUZE/BANDAGES/DRESSINGS) ×3 IMPLANT
DURAPREP 26ML APPLICATOR (WOUND CARE) ×3 IMPLANT
ELECT REM PT RETURN 9FT ADLT (ELECTROSURGICAL) ×3
ELECTRODE REM PT RTRN 9FT ADLT (ELECTROSURGICAL) ×1 IMPLANT
GAUZE SPONGE 4X4 12PLY STRL (GAUZE/BANDAGES/DRESSINGS) ×3 IMPLANT
GLOVE BIOGEL M STRL SZ7.5 (GLOVE) ×3 IMPLANT
GLOVE INDICATOR 8.0 STRL GRN (GLOVE) ×3 IMPLANT
GOWN STRL REUS W/ TWL LRG LVL3 (GOWN DISPOSABLE) ×2 IMPLANT
GOWN STRL REUS W/TWL LRG LVL3 (GOWN DISPOSABLE) ×4
KIT RM TURNOVER CYSTO AR (KITS) ×3 IMPLANT
MAT BLUE FLOOR 46X72 FLO (MISCELLANEOUS) ×3 IMPLANT
NS IRRIG 1000ML POUR BTL (IV SOLUTION) ×3 IMPLANT
PACK HIP COMPR (MISCELLANEOUS) ×3 IMPLANT
REAMER ROD DEEP FLUTE 2.5X950 (INSTRUMENTS) ×3 IMPLANT
SOL PREP PVP 2OZ (MISCELLANEOUS) ×3
SOLUTION PREP PVP 2OZ (MISCELLANEOUS) ×1 IMPLANT
STAPLER SKIN PROX 35W (STAPLE) ×3 IMPLANT
SUCTION FRAZIER HANDLE 10FR (MISCELLANEOUS) ×2
SUCTION TUBE FRAZIER 10FR DISP (MISCELLANEOUS) ×1 IMPLANT
SUT VIC AB 0 CT1 36 (SUTURE) ×3 IMPLANT
SUT VIC AB 1 CT1 36 (SUTURE) ×3 IMPLANT
SUT VIC AB 2-0 CT1 27 (SUTURE) ×2
SUT VIC AB 2-0 CT1 TAPERPNT 27 (SUTURE) ×1 IMPLANT
TAPE MICROFOAM 4IN (TAPE) ×3 IMPLANT

## 2017-05-24 SURGICAL SUPPLY — 44 items
BIT DRILL 4.3MMS DISTAL GRDTED (BIT) ×1 IMPLANT
CABLE CERLAGE W/CRIMP 1.8 (Cable) ×2 IMPLANT
CABLE CERLAGE W/CRIMP 1.8MM (Cable) ×1 IMPLANT
CANISTER SUCT 1200ML W/VALVE (MISCELLANEOUS) ×3 IMPLANT
CATH FOLEY 2WAY 12FR 5CC LATEX (CATHETERS) ×3 IMPLANT
CHLORAPREP W/TINT 26ML (MISCELLANEOUS) ×3 IMPLANT
DRAPE INCISE 23X17 IOBAN STRL (DRAPES) ×2
DRAPE INCISE IOBAN 23X17 STRL (DRAPES) ×1 IMPLANT
DRAPE SHEET LG 3/4 BI-LAMINATE (DRAPES) ×3 IMPLANT
DRAPE SURG 17X11 SM STRL (DRAPES) ×3 IMPLANT
DRAPE U-SHAPE 47X51 STRL (DRAPES) ×3 IMPLANT
DRILL 4.3MMS DISTAL GRADUATED (BIT) ×3
DRSG OPSITE POSTOP 3X4 (GAUZE/BANDAGES/DRESSINGS) ×6 IMPLANT
DRSG OPSITE POSTOP 4X6 (GAUZE/BANDAGES/DRESSINGS) ×3 IMPLANT
ELECT CAUTERY BLADE 6.4 (BLADE) ×3 IMPLANT
ELECT REM PT RETURN 9FT ADLT (ELECTROSURGICAL) ×3
ELECTRODE REM PT RTRN 9FT ADLT (ELECTROSURGICAL) ×1 IMPLANT
GAUZE SPONGE 4X4 12PLY STRL (GAUZE/BANDAGES/DRESSINGS) ×3 IMPLANT
GAUZE XEROFORM 4X4 STRL (GAUZE/BANDAGES/DRESSINGS) ×3 IMPLANT
GLOVE BIOGEL PI IND STRL 9 (GLOVE) ×1 IMPLANT
GLOVE BIOGEL PI INDICATOR 9 (GLOVE) ×2
GLOVE SURG SYN 9.0  PF PI (GLOVE) ×2
GLOVE SURG SYN 9.0 PF PI (GLOVE) ×1 IMPLANT
GOWN SRG 2XL LVL 4 RGLN SLV (GOWNS) ×1 IMPLANT
GOWN STRL NON-REIN 2XL LVL4 (GOWNS) ×2
GOWN STRL REUS W/ TWL LRG LVL3 (GOWN DISPOSABLE) ×1 IMPLANT
GOWN STRL REUS W/TWL LRG LVL3 (GOWN DISPOSABLE) ×2
GUIDEWIRE BALL NOSE 80CM (WIRE) ×3 IMPLANT
HFN RH 130 DEG 9MM X 440MM (Nail) ×3 IMPLANT
HIP FRAC NAIL LAG SCR 10.5X100 (Orthopedic Implant) ×2 IMPLANT
IV NS 500ML (IV SOLUTION) ×2
IV NS 500ML BAXH (IV SOLUTION) ×1 IMPLANT
KIT RM TURNOVER STRD PROC AR (KITS) ×3 IMPLANT
MAT BLUE FLOOR 46X72 FLO (MISCELLANEOUS) ×3 IMPLANT
NEEDLE FILTER BLUNT 18X 1/2SAF (NEEDLE) ×2
NEEDLE FILTER BLUNT 18X1 1/2 (NEEDLE) ×1 IMPLANT
PACK HIP COMPR (MISCELLANEOUS) ×3 IMPLANT
SCREW BONE CORTICAL 5.0X54 (Screw) ×3 IMPLANT
SCREW CANN THRD AFF 10.5X100 (Orthopedic Implant) ×1 IMPLANT
STAPLER SKIN PROX 35W (STAPLE) ×3 IMPLANT
SUT VIC AB 1 CT1 36 (SUTURE) ×3 IMPLANT
SUT VIC AB 2-0 CT1 (SUTURE) ×3 IMPLANT
SYRINGE 10CC LL (SYRINGE) ×3 IMPLANT
TAPE MICROFOAM 4IN (TAPE) ×3 IMPLANT

## 2017-05-24 NOTE — Anesthesia Procedure Notes (Signed)
Date/Time: 05/24/2017 11:44 AM Performed by: Ginger CarneMICHELET, Demere Dotzler Pre-anesthesia Checklist: Patient identified, Emergency Drugs available, Suction available, Patient being monitored and Timeout performed Patient Re-evaluated:Patient Re-evaluated prior to inductionOxygen Delivery Method: Simple face mask

## 2017-05-24 NOTE — Progress Notes (Signed)
Unable to determine spinal level  Dr Karlton Lemonkarenz aware of blood pressure up and down [id number]  Then 10min later 91   Does not want to treat further at this time

## 2017-05-24 NOTE — Progress Notes (Signed)
Dr GVS called  Unable to determine spinal level due to mental status   Dr GVS wants to keep pt another hour to see if pt can move legs   Pt sleeping at present

## 2017-05-24 NOTE — Anesthesia Preprocedure Evaluation (Addendum)
Anesthesia Evaluation  Patient identified by MRN, date of birth, ID band Patient confused and Patient unresponsive    Reviewed: Allergy & Precautions, NPO status , Patient's Chart, lab work & pertinent test results  Airway Mallampati: II       Dental  (+) Teeth Intact   Pulmonary COPD, former smoker,     + decreased breath sounds      Cardiovascular hypertension, Pt. on medications + dysrhythmias Atrial Fibrillation  Rhythm:Irregular     Neuro/Psych    GI/Hepatic negative GI ROS, Neg liver ROS,   Endo/Other  diabetes, Type 2  Renal/GU negative Renal ROS     Musculoskeletal   Abdominal Normal abdominal exam  (+)   Peds  Hematology   Anesthesia Other Findings   Reproductive/Obstetrics                             Anesthesia Physical Anesthesia Plan  ASA: III  Anesthesia Plan: Spinal   Post-op Pain Management:    Induction: Intravenous  PONV Risk Score and Plan: 0  Airway Management Planned: Natural Airway  Additional Equipment:   Intra-op Plan:   Post-operative Plan:   Informed Consent: I have reviewed the patients History and Physical, chart, labs and discussed the procedure including the risks, benefits and alternatives for the proposed anesthesia with the patient or authorized representative who has indicated his/her understanding and acceptance.     Plan Discussed with: CRNA  Anesthesia Plan Comments: (Discussed regional versus general anesthesia with patient's daughter, explained risk of spinal haematoma in view of elevated INR, With INR values trending down and with FFP going in, patient's daughter expressed a strong desire for spinal anesthesia, I concur.  GVS.)       Anesthesia Quick Evaluation

## 2017-05-24 NOTE — Progress Notes (Signed)
Blood pressure 85/53   Unable to communicate to pt to move toes or feet   Sleeps when not stimulated

## 2017-05-24 NOTE — Progress Notes (Addendum)
[facility] [place] at [facility]   PATIENT NAME: Spencer Little    MR#:  [id number]  DATE OF BIRTH:  09-Nov-1924  SUBJECTIVE:  CHIEF COMPLAINT:   Chief Complaint  Patient presents with  . Leg Injury    right   - Admitted for hip fracture, INR is at 1.48 today- will go for surgery with FFP - demented, denies any complaints  REVIEW OF SYSTEMS:  Review of Systems  Unable to perform ROS: Dementia    DRUG ALLERGIES:   Allergies  Allergen Reactions  . Penicillins     VITALS:  Blood pressure 120/72, pulse 87, temperature 97.6 F (36.4 C), temperature source Oral, resp. rate 18, height 6\' 1"  (1.854 m), weight 74.8 kg (165 lb), SpO2 96 %.  PHYSICAL EXAMINATION:  Physical Exam  GENERAL:  83 y.o.-year-old patient lying in the bed with no acute distress. Very talkative EYES: Pupils equal, round, reactive to light and accommodation. No scleral icterus. Extraocular muscles intact.  HEENT: Head atraumatic, normocephalic. Oropharynx and nasopharynx clear. Dry mucus membranes NECK:  Supple, no jugular venous distention. No thyroid enlargement, no tenderness.  LUNGS: Normal breath sounds bilaterally, no wheezing, rales,rhonchi or crepitation. No use of accessory muscles of respiration. Decreased bibasilar breath sounds CARDIOVASCULAR: S1, S2 normal. No  rubs, or gallops. 2/6 systolic murmur present ABDOMEN: Soft, nontender, nondistended. Bowel sounds present. No organomegaly or mass.  EXTREMITIES: No pedal edema, cyanosis, or clubbing.  NEUROLOGIC: Cranial nerves II through XII are intact. Muscle strength 5/5 in all extremities except right leg due to fracture. Right hip is abducted, externally rotated.Sensation intact. Gait not checked.  PSYCHIATRIC: The patient is alert and oriented to self, no short term memory, confused.  SKIN: No obvious rash, lesion, or ulcer. Varicose veins noted all over the right leg, engorged as well   LABORATORY PANEL:    CBC  Recent Labs Lab 05/24/17 0435  WBC 13.1*  HGB 10.3*  HCT 30.4*  PLT 181   ------------------------------------------------------------------------------------------------------------------  Chemistries   Recent Labs Lab 05/21/17 1348 05/22/17 0444  05/24/17 0435  NA 139 136  < > 142  K 4.8 3.3*  < > 3.9  CL 103 98*  < > 106  CO2 29 29  < > 27  GLUCOSE 170* 148*  < > 141*  BUN 20 25*  < > 31*  CREATININE 0.88 0.94  < > 0.88  CALCIUM 9.2 8.9  < > 9.1  MG  --  2.0  --   --   AST 31  --   --   --   ALT 17  --   --   --   ALKPHOS 66  --   --   --   BILITOT 1.0  --   --   --   < > = values in this interval not displayed. ------------------------------------------------------------------------------------------------------------------  Cardiac Enzymes No results for input(s): TROPONINI in the last 168 hours. ------------------------------------------------------------------------------------------------------------------  RADIOLOGY:  No results found.  EKG:   Orders placed or performed during the hospital encounter of 05/21/17  . EKG 12-Lead  . EKG 12-Lead  . ED EKG  . ED EKG    ASSESSMENT AND PLAN:   Spencer Little  is a 83 y.o. male with a known history of Atrial fibrillation on Coumadin, moderate to severe dementia due to Alzheimer's and vascular dementia, non-insulin-dependent diabetes mellitus presents to the hospital secondary to a fall and right hip fracture.  #1 Right hip fracture- due to mechanical  fall - orthopedics consulted, patient was on Coumadin at home, INR at 1.4 today, FFP during surgery -Moderate cardiac risk for noncardiac surgery at this time. - Pain management. Physical therapy after the surgery. -Start incentive spirometry, patient had a low-grade temperature yesterday. Urine analysis was normal on admission. -Currently on a dysphagia level II diet based on speech therapy recommendations. Monitor closely to prevent  aspiration  #2 hypertension-continue lisinopril, Norvasc. Hold Lasix.   #3 diabetes mellitus- on sliding scale insulin.  #4 dementia-follows with neurology as outpatient. Continue Aricept and Namenda. Conversational and oriented to self at baseline.  #5 atrial fibrillation-chronic, stable. Heart rate on the lower side, so not on any rate limiting medications. ECHO done yesterday -Coumadin on hold currently.  #6 DVT prophylaxis-possible surgery today      All the records are reviewed and case discussed with Care Management/Social Workerr. Management plans discussed with the patient, family and they are in agreement.  CODE STATUS: Full code  TOTAL TIME TAKING CARE OF THIS PATIENT: 38 minutes.   POSSIBLE D/C IN 2-3 DAYS, DEPENDING ON CLINICAL CONDITION.   Enid BaasKALISETTI,Deontrae Drinkard M.D on 05/24/2017 at 8:41 AM  Between 7am to 6pm - Pager - [phone]  After 6pm go to [email or web address] - Social research officer, governmentpassword EPAS [facility]  [facility]  Office  [phone]  CC: Primary care physician; Veneda MelterWhite, Christina M, FNP

## 2017-05-24 NOTE — Transfer of Care (Signed)
Immediate Anesthesia Transfer of Care Note  Patient: Francina AmesEurell C Abdulaziz  Procedure(s) Performed: Procedure(s): INTRAMEDULLARY (IM) NAIL FEMORAL (N/A)  Patient Location: PACU  Anesthesia Type:Spinal  Level of Consciousness: unresponsive and responds to stimulation same as preop  Airway & Oxygen Therapy: Patient Spontanous Breathing  Post-op Assessment: Report given to RN and Post -op Vital signs reviewed and stable  Post vital signs: Reviewed and stable  Last Vitals:  Vitals:   05/24/17 1105 05/24/17 1352  BP: 104/66 (!) 118/98  Pulse: 69 66  Resp: (!) 24 10  Temp: 36.1 C (!) 36.1 C    Last Pain:  Vitals:   05/24/17 1352  TempSrc: Temporal  PainSc:       Patients Stated Pain Goal: 3 (05/21/17 1747)  Complications: No apparent anesthesia complications

## 2017-05-24 NOTE — Plan of Care (Signed)
Problem: SLP Dysphagia Goals Goal: Misc Dysphagia Goal Pt will safely tolerate po diet of least restrictive consistency w/ no overt s/s of aspiration noted by Staff/pt/family x3 sessions.    

## 2017-05-24 NOTE — Anesthesia Procedure Notes (Signed)
Spinal  Start time: 05/24/2017 11:23 AM End time: 05/24/2017 11:40 AM Staffing Anesthesiologist: Elijio MilesVAN STAVEREN, Alayshia Marini F Preanesthetic Checklist Completed: patient identified, site marked, surgical consent, pre-op evaluation, timeout performed, IV checked, risks and benefits discussed and monitors and equipment checked Spinal Block Patient position: right lateral decubitus Prep: Betadine Patient monitoring: heart rate and continuous pulse ox Approach: midline Location: L3-4 Injection technique: single-shot Needle Needle type: Quincke  Needle gauge: 25 G Needle length: 9 cm Needle insertion depth: 5 cm Assessment Sensory level: T8

## 2017-05-24 NOTE — Progress Notes (Signed)
Clinical Social Worker (CSW) discussed case with MD. Per MD patient will not be stable for D/C until Monday. Plan is for patient to D/C to [place] Monday 05/27/17. [facility] admissions coordinator at [facility] is aware of above. CSW met with patient's daughter Neoma Laming and made her aware of above. CSW will continue to follow and assist as needed.   [employer], LCSW [phone]

## 2017-05-24 NOTE — Op Note (Addendum)
05/21/2017 - 05/24/2017  1:50 PM  PATIENT:  Spencer Little  83 y.o. male  PRE-OPERATIVE DIAGNOSIS:  right hip fracture subtrochanteric, reverse obliquity  POST-OPERATIVE DIAGNOSIS:  right hip fracture  PROCEDURE:  Procedure(s): INTRAMEDULLARY (IM) NAIL FEMORAL (N/A)  SURGEON: Leitha SchullerMichael J Kaius Daino, MD  ASSISTANTS: None  ANESTHESIA:   spinal  EBL:  Total I/O In: 1346 [I.V.:1050; Blood:296] Out: -   BLOOD ADMINISTERED:none  DRAINS: none   LOCAL MEDICATIONS USED:  NONE  SPECIMEN:  No Specimen  DISPOSITION OF SPECIMEN:  N/A  COUNTS:  YES  TOURNIQUET:  * No tourniquets in log *  IMPLANTS: Biomet affixes 9 x 4 4030 rod with 100 mm lag screw 54 mm interference screw, 1 cerclage wire  DICTATION: .Dragon Dictation patient brought the operating room and after adequate anesthesia was obtained and Foley placed, the patient was transferred to the fracture table where right leg was placed in the traction boot with traction applied left leg in the well-leg holder. C-arm was brought in and good alignment was obtained. After prepping and draping in sterile fashion appropriate patient identification and timeout procedure completed. This point the procedure was opening up the lateral femur with a lateral approach incising the IT band elevated and SL's placing a clamp around the fracture site to reduce the lateral spike. After this was accomplished was a some difficulty a wire was passed and tightened and crimped this gave good alignment and stability such that a rod could be placed. A small incision was made proximal to the greater trochanter and proximal drilling carried out after guidewire was inserted in the appropriate position and a guide were inserted down the canal. Measurement was made off of this to determine rod length. The reaming was carried out to 11 mm which gave chatter and so 9 x 4 40 rod was inserted to the appropriate depth. A screw was placed into the head after first placing a  guidewire measuring off of this drilling and then placing the screw this was tightened down is essentially a fixed angle device as it would not be compressing with a reverse obliquity. Next the traction was released and a distal interlocking screw was placed through the oblique hole first drilling and measurement and then the 54 mm screw. Permanent C-arm views were obtained the wounds were irrigated fibula was placed at the level of the vastus lateralis to minimize postoperative bleeding with him being on anticoagulation. The IT band was closed in a running #1 Vicryl 2-0 Vicryl subcutaneous and skin staples. Xeroform and honeycomb dressings applied  PLAN OF CARE: Continue as an inpatient  PATIENT DISPOSITION:  PACU - hemodynamically stable.

## 2017-05-24 NOTE — Progress Notes (Signed)
SLP Cancellation Note  Patient Details Name: Spencer Little MRN: [id number] DOB: 05/20/1924   Cancelled treatment:       Reason Eval/Treat Not Completed: Patient at procedure or test/unavailable (Pt is NPO for surgery. Pt appears fatigued, weak.). Met w/ family who reported pt continues to have difficulty w/ phlegm and not sitting fully upright and forward(leans to his Left).  Due to the extended nature of his illness, current hip surgery to be completed, and his advanced Dementia, pt continues to present w/ increased risk for dysphagia and aspiration - recommend downgrade of diet to Dysphagia level 1(Puree) w/ NECTAR liquids, aspiration precautions. NSG updated. ST services will f/u tomorrow. Family informed and agreed.   Orinda Kenner, MS, CCC-SLP Watson,Katherine 05/24/2017, 10:38 AM

## 2017-05-24 NOTE — Plan of Care (Signed)
Patient IV patent. Patient has been NPO since midnight.   CHG x2 completed. Consent signed.  Report given. RN assessment and VS revealed stability.  Vitamin K given - all other meds held.

## 2017-05-24 NOTE — Anesthesia Post-op Follow-up Note (Cosign Needed)
Anesthesia QCDR form completed.        

## 2017-05-25 ENCOUNTER — Inpatient Hospital Stay: Payer: Medicare Other

## 2017-05-25 LAB — BASIC METABOLIC PANEL
ANION GAP: 8 (ref 5–15)
BUN: 36 mg/dL — ABNORMAL HIGH (ref 6–20)
CALCIUM: 8.5 mg/dL — AB (ref 8.9–10.3)
CHLORIDE: 110 mmol/L (ref 101–111)
CO2: 24 mmol/L (ref 22–32)
CREATININE: 0.7 mg/dL (ref 0.61–1.24)
GFR calc Af Amer: >60 mL/min (ref >60)
GFR calc non Af Amer: >60 mL/min (ref >60)
Glucose, Bld: 169 mg/dL — ABNORMAL HIGH (ref 65–99)
Potassium: 3.6 mmol/L (ref 3.5–5.1)
SODIUM: 142 mmol/L (ref 135–145)

## 2017-05-25 LAB — GLUCOSE, CAPILLARY
GLUCOSE-CAPILLARY: 188 mg/dL — AB (ref 65–99)
Glucose-Capillary: 183 mg/dL — ABNORMAL HIGH (ref 65–99)
Glucose-Capillary: 165 mg/dL — ABNORMAL HIGH (ref 65–99)
Glucose-Capillary: 151 mg/dL — ABNORMAL HIGH (ref 65–99)

## 2017-05-25 LAB — CBC
HCT: 24.8 % — ABNORMAL LOW (ref 40.0–52.0)
HEMOGLOBIN: 8.5 g/dL — AB (ref 13.0–18.0)
MCH: 33 pg (ref 26.0–34.0)
MCHC: 34.3 g/dL (ref 32.0–36.0)
MCV: 96.2 fL (ref 80.0–100.0)
Platelets: 174 10*3/uL (ref 150–440)
RBC: 2.58 MIL/uL — AB (ref 4.40–5.90)
RDW: 13.7 % (ref 11.5–14.5)
WBC: 9.8 10*3/uL (ref 3.8–10.6)

## 2017-05-25 LAB — PROTIME-INR
INR: 1.61
Prothrombin Time: 19.3 seconds — ABNORMAL HIGH (ref 11.4–15.2)

## 2017-05-25 MED ORDER — WARFARIN SODIUM 3 MG PO TABS: 5.0000 mg | ORAL_TABLET | Freq: Every day | ORAL | Status: AC

## 2017-05-25 MED ORDER — WARFARIN - PHYSICIAN DOSING INPATIENT
Freq: Every day | Status: DC
  Administered 2017-05-27 – 2017-05-28 (×2)

## 2017-05-25 NOTE — Anesthesia Postprocedure Evaluation (Signed)
Anesthesia Post Note  Patient: Francina AmesEurell C Fitting  Procedure(s) Performed: Procedure(s) (LRB): INTRAMEDULLARY (IM) NAIL FEMORAL (N/A)  Patient location during evaluation: Nursing Unit Anesthesia Type: Spinal Level of consciousness: awake and alert Pain management: pain level controlled Vital Signs Assessment: post-procedure vital signs reviewed and stable Respiratory status: spontaneous breathing, respiratory function stable and nonlabored ventilation Cardiovascular status: blood pressure returned to baseline and stable Postop Assessment: no headache, no backache and spinal receding Anesthetic complications: no   PT at bedside with patient when seen, PT reports that he was out of bed to chair with max assist, patient denies any numbness/tingling in legs, states they feel normal, denies any back pain/pressure.  Last Vitals:  Vitals:   05/25/17 0429 05/25/17 0727  BP: (!) 145/82 138/70  Pulse: 66 85  Resp: 19   Temp: 37.1 C 36.4 C    Last Pain:  Vitals:   05/25/17 0947  TempSrc:   PainSc: 0-No pain                 Tamzin Bertling

## 2017-05-25 NOTE — Evaluation (Signed)
Occupational Therapy Evaluation Patient Details Name: Spencer Little MRN: [id number] DOB: [birth year] Today's Date: 05/25/2017    History of Present Illness Pt is a 83 y.o. male s/p fall weeding his garden sustaining a comminuted R peritrochanteric/subtrochanteric femur fx 05/21/17 (surgery delayed d/t elevated INR).  Pt s/p IMN 05/24/17.  PMH includes dementia, a-fib on Coumadin, DM, htn.   Clinical Impression   Pt seen for OT Evaluation this date. Pt fatigued from previous therapy sessions this date, but no reported pain. Pt pleasant and able to answer most questions appropriately, perseverating on describing his home layout at times requiring a verbal cue to redirect. Daughter present for duration of session, able to verify pt history provided. Pt living at home with spouse who manages pt's medications to ensure he takes them. Spouse and daughter have noticed more recently that pt has been expectorating after taking medications and meals. This was noted by OT after daughter assisted in swabbing pt's dry mouth. No distress noted during/afterwards. Pt typically independent with basic self care, taking seated showers, and 24/7 supervision available in the home. Pt presents with weakness and decreased strength/ROM/activity tolerance and would benefit from skilled OT services and STR to address noted impairments and functional deficits in functional mobility and self care skills in order to maximize return to PLOF prior to return home.     Follow Up Recommendations  SNF    Equipment Recommendations  Toilet rise with handles    Recommendations for Other Services       Precautions / Restrictions Precautions Precautions: Fall Restrictions Weight Bearing Restrictions: Yes RLE Weight Bearing: Weight bearing as tolerated      Mobility Bed Mobility Overal bed mobility: Needs Assistance Bed Mobility: Supine to Sit     Supine to sit: Max assist;HOB elevated     General bed mobility comments:  deferred due to pt fatigue/safety, please refer to PT note  Transfers Overall transfer level: Needs assistance Equipment used: Rolling walker (2 wheeled) Transfers: Sit to/from [employer] Sit to Stand: Max assist;From elevated surface   Squat pivot transfers: Max assist     General transfer comment: deferred due to pt fatigue/safety, please refer to PT note    Balance Overall balance assessment: Needs assistance;History of Falls Sitting-balance support: Bilateral upper extremity supported;Feet supported Sitting balance-Leahy Scale: Poor Sitting balance - Comments: deferred due to pt fatigue/safety, please refer to PT note Postural control: Left lateral lean                                 ADL either performed or assessed with clinical judgement   ADL Overall ADL's : Needs assistance/impaired Eating/Feeding: NPO Eating/Feeding Details (indicate cue type and reason): recent CT scan and NPO at this time Grooming: Bed level;Set up   Upper Body Bathing: Set up;Minimal assistance;Bed level   Lower Body Bathing: Bed level;Maximal assistance   Upper Body Dressing : Minimal assistance;Bed level   Lower Body Dressing: Maximal assistance;Bed level     Toilet Transfer Details (indicate cue type and reason): deferred due to pt safety/fatigue after PT                  Vision Baseline Vision/History: Wears glasses Wears Glasses: Reading only (reading glasses but wears them often) Patient Visual Report: No change from baseline Vision Assessment?: No apparent visual deficits     Perception     Praxis      Pertinent  Vitals/Pain Pain Assessment: No/denies pain Faces Pain Scale: Hurts little more Pain Location: R hip Pain Descriptors / Indicators: Grimacing;Guarding;Sore Pain Intervention(s): Limited activity within patient's tolerance;Monitored during session;Premedicated before session     Hand Dominance     Extremity/Trunk Assessment  Upper Extremity Assessment Upper Extremity Assessment: Generalized weakness (grossly 4-/5 bilaterally, ROM WFL)   Lower Extremity Assessment Lower Extremity Assessment: Defer to PT evaluation;RLE deficits/detail RLE Deficits / Details: hip flexion at least 2/5; knee flexion/extension at least 2+/5; DF at least 3/5 (all limited d/t R hip pain) RLE: Unable to fully assess due to pain LLE Deficits / Details: strength and ROM WFL       Communication Communication Communication: HOH (dementia)   Cognition Arousal/Alertness: Awake/alert Behavior During Therapy: WFL for tasks assessed/performed Overall Cognitive Status: History of cognitive impairments - at baseline                                 General Comments: Pt's daughter reports decline in cognition last week. 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"; educated in toiler riser for standard toilet to improve safety for transfers due to pt's height    Shoulder Instructions      Home Living Family/patient expects to be discharged to:: Private residence Living Arrangements: Spouse/significant other ([facility] Available Help at Discharge: Family;Personal care attendant;Available 24 hours/day Type of Home: House Home Access: Ramped entrance     Home Layout: One level     Bathroom Shower/Tub: Producer, television/film/videoWalk-in shower   Bathroom Toilet: Handicapped height (1 BSC over commode, 1 regular height)     Home Equipment: Walker - 2 wheels;Walker - 4 wheels;Grab bars - tub/shower;Bedside commode;Shower seat          Prior Functioning/Environment Level of Independence: Needs assistance  Gait / Transfers Assistance Needed: pt's daughter reports pt is "unstable walking" last 6 months and encourages pt to use walker but pt  tends not to use any AD.  ADL's / Homemaking Assistance Needed: Spouse administers medications to ensure thoroughness/adherence, pt takes seated shower; pt loves to "keep a clean yard" and pick up sticks    Comments: Pt has 24/7 supervision        OT Problem List: Decreased strength;Pain;Decreased range of motion;Decreased cognition;Decreased safety awareness;Decreased activity tolerance;Impaired balance (sitting and/or standing);Decreased knowledge of use of DME or AE;Decreased knowledge of precautions      OT Treatment/Interventions: Self-care/ADL training;Therapeutic exercise;Therapeutic activities;Energy conservation;DME and/or AE instruction;Patient/family education    OT Goals(Current goals can be found in the care plan section) Acute Rehab OT Goals Patient Stated Goal: to be able to walk again OT Goal Formulation: With patient/family Time For Goal Achievement: 06/08/17 Potential to Achieve Goals: Good  OT Frequency: Min 1X/week   Barriers to D/C:            Co-evaluation              AM-PAC PT "6 Clicks" Daily Activity     Outcome Measure Help from another person eating meals?: None Help from another person taking care of personal grooming?: None Help from another person toileting, which includes using toliet, bedpan, or urinal?: A Lot Help from another person bathing (including washing, rinsing, drying)?: A Lot Help from another  person to put on and taking off regular upper body clothing?: A Little Help from another person to put on and taking off regular lower body clothing?: A Lot 6 Click Score: 17   End of Session    Activity Tolerance: Patient limited by fatigue;Patient tolerated treatment well Patient left: in bed;with call bell/phone within reach;with bed alarm set;with family/visitor present  OT Visit Diagnosis: Other abnormalities of gait and mobility (R26.89);History of falling (Z91.81);Muscle weakness (generalized) (M62.81)                Time:  1610-9604 OT Time Calculation (min): 30 min Charges:  OT General Charges $OT Visit: 1 Procedure OT Evaluation $OT Eval Low Complexity: 1 Procedure OT Treatments $Self Care/Home Management : 8-22 mins G-Codes:     Richrd Prime, MPH, MS, OTR/L ascom [phone] 05/25/17, 2:51 PM

## 2017-05-25 NOTE — Progress Notes (Signed)
  Subjective: 1 Day Post-Op Procedure(s) (LRB): INTRAMEDULLARY (IM) NAIL FEMORAL (N/A) Patient reports pain as mild.   Patient is well but has a history of dementia Plan is to go Skilled nursing facility after hospital stay. Negative for chest pain and shortness of breath Fever: no Gastrointestinal:Negative for nausea and vomiting  Objective: Vital signs in last 24 hours: Temp:  [96.9 F (36.1 C)-98.7 F (37.1 C)] 97.6 F (36.4 C) (06/09 0727) Pulse Rate:  [47-105] 85 (06/09 0727) Resp:  [10-24] 19 (06/09 0429) BP: (82-145)/(50-98) 138/70 (06/09 0727) SpO2:  [96 %-100 %] 98 % (06/09 0727) Weight:  [74.8 kg (165 lb)] 74.8 kg (165 lb) (06/08 1105)  Intake/Output from previous day:  Intake/Output Summary (Last 24 hours) at 05/25/17 0811 Last data filed at 05/25/17 0648  Gross per 24 hour  Intake             3501 ml  Output              750 ml  Net             2751 ml    Intake/Output this shift: No intake/output data recorded.  Labs:  Recent Labs  05/24/17 0435 05/25/17 0310  HGB 10.3* 8.5*    Recent Labs  05/24/17 0435 05/25/17 0310  WBC 13.1* 9.8  RBC 3.23* 2.58*  HCT 30.4* 24.8*  PLT 181 174    Recent Labs  05/24/17 0435 05/25/17 0310  NA 142 142  K 3.9 3.6  CL 106 110  CO2 27 24  BUN 31* 36*  CREATININE 0.88 0.70  GLUCOSE 141* 169*  CALCIUM 9.1 8.5*    Recent Labs  05/23/17 1308 05/24/17 0435  INR 1.73 1.48     EXAM General - Patient is Alert, Confused and Lacking Extremity - ABD soft Sensation intact distally Intact pulses distally Dorsiflexion/Plantar flexion intact Incision: scant drainage No cellulitis present Dressing/Incision - blood tinged drainage Motor Function - intact, moving foot and toes well on exam.  Past Medical History:  Diagnosis Date  . Atrial fibrillation (HCC)   . 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 Diabetes (HCC)   . Hypertension     Assessment/Plan: 1 Day Post-Op  Procedure(s) (LRB): INTRAMEDULLARY (IM) NAIL FEMORAL (N/A) Active Problems:   Hip fracture (HCC)   Pressure injury of skin  Estimated body mass index is 21.77 kg/m as calculated from the following:   Height as of this encounter: 6\' 1"  (1.854 m).   Weight as of this encounter: 74.8 kg (165 lb). Advance diet Up with therapy D/C IV fluids when tolerating po intake.  Labs reviewed, Hg 8.5.  Repeat CBC tomorrow, monitor for possible transfusion. Up with therapy today, will likely need SNF placement upon discharge. BMP ordered for tomorrow morning. Daily INR checks.  DVT Prophylaxis - Lovenox, Foot Pumps and TED hose Weight-Bearing as tolerated to right leg  J. Horris LatinoLance Elnora Quizon, PA-C [facility] 05/25/2017, 8:11 AM

## 2017-05-25 NOTE — Progress Notes (Signed)
Ch. Made a follow up visit with pt. Pt was being transported to CT. Ch. Had prayer with pt. Before surgery.

## 2017-05-25 NOTE — Progress Notes (Signed)
  Speech Language Pathology Treatment: Dysphagia  Patient Details Name: Spencer Little MRN: [id number] DOB: 08/10/1924 Today's Date: 05/25/2017 Time: [id number] SLP Time Calculation (min) (ACUTE ONLY): 35 min  Assessment / Plan / Recommendation Clinical Impression  Pt's daughter and nsg report that pt has had increasing difficulty with swallowing all consistencies and medications. Pt given trials of nectar thick liquids and magic cup. Pt demonstrated multiple swallows with each tsp size bite and then immediately following swallows demonstrated throat clearing and then expectorated all boluses. Pt complained of feeling residue/phlegm in throat following swallows. Suspect significant pharyngeal residue during swallow which increases pt risk of aspiration. Recommend making pt NPO at this time and will plan for MBSS on Monday to determine least restrictive safe diet. Discussed with pt's daughter, nsg and MD; all in agreement.   HPI HPI: Spencer Little  is a 83 y.o. male with a known history of Atrial fibrillation on Coumadin, moderate to severe dementia due to Alzheimer's and vascular dementia, non-insulin-dependent diabetes mellitus presents to the hospital secondary to a fall and right hip fracture. Pt was evaluated by bedside swallow on 6/6 and recommended a Dys II diet w/thin liquids. Pt had repair of hip fracture yesterday and was downgraded to a Dysphagia I diet with nectar thick liquids.       SLP Plan  MBS       Recommendations  Diet recommendations: NPO Medication Administration: Via alternative means                Oral Care Recommendations: Staff/trained caregiver to provide oral care;Oral care QID Follow up Recommendations: Skilled Nursing facility SLP Visit Diagnosis: Dysphagia, pharyngoesophageal phase (R13.14) Plan: MBS       GO                Hubbell,Maaran 05/25/2017, 9:57 AM

## 2017-05-25 NOTE — Progress Notes (Signed)
[employer] [facility] [place] [facility]   PATIENT NAME: Spencer Little    MR#:  [id number]  DATE OF BIRTH:  June 10, 1924  SUBJECTIVE:  CHIEF COMPLAINT:   Chief Complaint  Patient presents with  . Leg Injury    right   - Postoperative day 1 after right hip fracture surgery, confused and has dementia at baseline - Difficulty swallowing noted today  REVIEW OF SYSTEMS:  Review of Systems  Unable to perform ROS: Dementia    DRUG ALLERGIES:   Allergies  Allergen Reactions  . Penicillins     VITALS:  Blood pressure 138/70, pulse 85, temperature 97.6 F (36.4 C), temperature source Oral, resp. rate 19, height 6\' 1"  (1.854 m), weight 74.8 kg (165 lb), SpO2 98 %.  PHYSICAL EXAMINATION:  Physical Exam  GENERAL:  83 y.o.-year-old patient lying in the bed with no acute distress. Confused EYES: Pupils equal, round, reactive to light and accommodation. No scleral icterus. Extraocular muscles intact.  HEENT: Head atraumatic, normocephalic. Oropharynx and nasopharynx clear. Dry mucus membranes NECK:  Supple, no jugular venous distention. No thyroid enlargement, no tenderness.  LUNGS: Normal breath sounds bilaterally, no wheezing, rales,rhonchi or crepitation. No use of accessory muscles of respiration. Decreased bibasilar breath sounds CARDIOVASCULAR: S1, S2 normal. No  rubs, or gallops. 2/6 systolic murmur present ABDOMEN: Soft, nontender, nondistended. Bowel sounds present. No organomegaly or mass.  EXTREMITIES: No pedal edema, cyanosis, or clubbing.  NEUROLOGIC: Cranial nerves II through XII are intact. Muscle strength 5/5 in all extremities except right leg due to fracture. Right hip is abducted, externally rotated.dressing in place. Sensation intact. Gait not checked.  PSYCHIATRIC: The patient is alert and oriented to self, no short term memory, confused.  SKIN: No obvious rash, lesion, or ulcer. Varicose veins noted all over the right leg, engorged as  well   LABORATORY PANEL:   CBC  Recent Labs Lab 05/25/17 0310  WBC 9.8  HGB 8.5*  HCT 24.8*  PLT 174   ------------------------------------------------------------------------------------------------------------------  Chemistries   Recent Labs Lab 05/21/17 1348 05/22/17 0444  05/25/17 0310  NA 139 136  < > 142  K 4.8 3.3*  < > 3.6  CL 103 98*  < > 110  CO2 29 29  < > 24  GLUCOSE 170* 148*  < > 169*  BUN 20 25*  < > 36*  CREATININE 0.88 0.94  < > 0.70  CALCIUM 9.2 8.9  < > 8.5*  MG  --  2.0  --   --   AST 31  --   --   --   ALT 17  --   --   --   ALKPHOS 66  --   --   --   BILITOT 1.0  --   --   --   < > = values in this interval not displayed. ------------------------------------------------------------------------------------------------------------------  Cardiac Enzymes No results for input(s): TROPONINI in the last 168 hours. ------------------------------------------------------------------------------------------------------------------  RADIOLOGY:  Ct Head Wo Contrast  Result Date: 05/25/2017 CLINICAL DATA:  Dysphagia and weakness. EXAM: CT HEAD WITHOUT CONTRAST TECHNIQUE: Contiguous axial images were obtained from the base of the skull through the vertex without intravenous contrast. COMPARISON:  05/21/2017 and 09/07/2015 head CTs FINDINGS: Brain: No evidence of acute infarction, hemorrhage, hydrocephalus, extra-axial collection or mass lesion/mass effect. Moderate atrophy and chronic small-vessel white matter ischemic changes again noted. Vascular: No hyperdense vessel or unexpected calcification. Skull: Normal. Negative for fracture or focal lesion. Sinuses/Orbits: No acute finding. Other:  None. IMPRESSION: No evidence of acute intracranial abnormality. Atrophy and chronic small-vessel white matter ischemic changes. Electronically Signed   By: Harmon PierJeffrey  Hu M.D.   On: 05/25/2017 12:02   Dg Hip Operative Unilat With Pelvis Right  Result Date:  05/24/2017 CLINICAL DATA:  Status post hip fracture repair EXAM: OPERATIVE RIGHT HIP (WITH PELVIS IF PERFORMED) 3 VIEWS TECHNIQUE: Fluoroscopic spot image(s) were submitted for interpretation post-operatively. COMPARISON:  May 21, 2017 FINDINGS: A rod and gamma nail now extend across the right hip fracture with a proximal cerclage wire and a distal interlocking screw. IMPRESSION: Right hip fracture repair as above. Electronically Signed   By: Gerome Samavid  Williams III M.D   On: 05/24/2017 13:35    EKG:   Orders placed or performed during the hospital encounter of 05/21/17  . EKG 12-Lead  . EKG 12-Lead  . ED EKG  . ED EKG    ASSESSMENT AND PLAN:   Spencer Little  is a 83 y.o. male with a known history of Atrial fibrillation on Coumadin, moderate to severe dementia due to Alzheimer's and vascular dementia, non-insulin-dependent diabetes mellitus presents to the hospital secondary to a fall and right hip fracture.  #1 Right hip fracture- due to mechanical fall - orthopedics consulted,Status post surgery and postop day 1 today. - Pain management. Physical therapy today. -Start incentive spirometry, Urine analysis was normal on admission.  #2 hypertension-continue lisinopril, Norvasc. Hold Lasix.   #3 Dysphagia-no trouble swallowing food at baseline, however noted to dysphagia today. -CT of the head ordered to rule out stroke especially since patient has been off of his Coumadin. -Also thyroid ultrasound to check at his goiter. Speech therapy recommended nothing by mouth at this time and modified barium swallow study on Monday.  #4 dementia-follows with neurology as outpatient. Continue Aricept and Namenda. Conversational and oriented to self at baseline.  #5 atrial fibrillation-chronic, stable. Heart rate on the lower side, so not on any rate limiting medications. ECHO done -Coumadin to be restarted today.  #6 DVT Coumadin can be restarted. lovenox for now      All the records are  reviewed and case discussed with Care Management/Social Workerr. Management plans discussed with the patient, family and they are in agreement.  CODE STATUS: Full code  TOTAL TIME TAKING CARE OF THIS PATIENT: 38 minutes.   POSSIBLE D/C IN 2-3 DAYS, DEPENDING ON CLINICAL CONDITION.   Iva Posten M.D on 05/25/2017 at 12:25 PM  Between 7am to 6pm - Pager - [phone]  After 6pm go to [email or web address] - Social research officer, governmentpassword EPAS [facility]  [facility] [place] [facility]  Office  [phone]  CC: Primary care physician; Veneda MelterWhite, Christina M, FNP

## 2017-05-25 NOTE — Evaluation (Signed)
Physical Therapy Evaluation Patient Details Name: Spencer Little MRN: [id number] DOB: Nov 26, 1924 Today's Date: 05/25/2017   History of Present Illness  Pt is a 83 y.o. male s/p fall weeding his garden sustaining a comminuted R peritrochanteric/subtrochanteric femur fx 05/21/17 (surgery delayed d/t elevated INR).  Pt s/p IMN 05/24/17.  PMH includes dementia, a-fib on Coumadin, DM, htn.  Clinical Impression  Prior to hospital admission, pt was ambulating without assistive device (although encouraged to use walker d/t balance concerns) and had 24/7 supervision.  Currently pt is max assist supine to sit and max assist squat/stand pivot transfer bed to chair to L side (pt unable to come to full standing with max assist of therapist, use of RW, and bed height elevated).  Pt would benefit from skilled PT to address noted impairments and functional limitations (see below for any additional details).  Upon hospital discharge, recommend pt discharge to STR.    Follow Up Recommendations SNF    Equipment Recommendations  Rolling walker with 5" wheels    Recommendations for Other Services       Precautions / Restrictions Precautions Precautions: Fall Restrictions Weight Bearing Restrictions: Yes RLE Weight Bearing: Weight bearing as tolerated      Mobility  Bed Mobility Overal bed mobility: Needs Assistance Bed Mobility: Supine to Sit     Supine to sit: Max assist;HOB elevated     General bed mobility comments: vc's for technique; assist for trunk and R>L LE; increased time and effort to perform  Transfers Overall transfer level: Needs assistance Equipment used: Rolling walker (2 wheeled) Transfers: Sit to/from [employer] Sit to Stand: Max assist;From elevated surface   Squat pivot transfers: Max assist     General transfer comment: Bed height elevated a couple inches; max assist to come to 3/4th stand with RW (pt unable to come to full upright position and required  max assist to control descent back to sitting on edge of bed); max assist squat/stand pivot to L bed to chair with vc's for hand and feet placement and technique  Ambulation/Gait             General Gait Details: Not appropriate at this time d/t pt's difficulty with standing with RW  Stairs            Wheelchair Mobility    Modified Rankin (Stroke Patients Only)       Balance Overall balance assessment: Needs assistance Sitting-balance support: Bilateral upper extremity supported;Feet supported Sitting balance-Leahy Scale: Poor Sitting balance - Comments: pt leaning to L (d/t R hip pain) but able to correct with vc's for hand positioning (and holding onto lower bed rail with R UE) Postural control: Left lateral lean                                   Pertinent Vitals/Pain Pain Assessment: Faces Faces Pain Scale: Hurts little more (0/10 at rest beginning and end of session) Pain Location: R hip Pain Descriptors / Indicators: Grimacing;Guarding;Sore Pain Intervention(s): Limited activity within patient's tolerance;Monitored during session;Repositioned  O2 on room air stable and WFL throughout treatment session.  HR 74 bpm at rest and briefly increased to 120 bpm after transfer to chair (HR returned to mid 90's with sitting rest break).    Home Living Family/patient expects to be discharged to:: Private residence Living Arrangements: Spouse/significant other (Spouse recently discharged home from [facility]) Available Help at Discharge: Family;Personal care attendant  Type of Home: House Home Access: Ramped entrance     Home Layout: One level Home Equipment: Walker - 2 wheels;Walker - 4 wheels;Grab bars - tub/shower;Bedside commode (Wife currently using RW)      Prior Function Level of Independence: Independent         Comments: Pt's daughter reports pt with "unstable walking" last 6 months and pt was encouraged to use walker but pt tended not to use any  assistive device with walker.  No reported falls in past 6 months.  Pt's daughter reports pt has 24/7 supervision.     Hand Dominance        Extremity/Trunk Assessment   Upper Extremity Assessment Upper Extremity Assessment: Generalized weakness    Lower Extremity Assessment Lower Extremity Assessment: RLE deficits/detail;LLE deficits/detail RLE Deficits / Details: hip flexion at least 2/5; knee flexion/extension at least 2+/5; DF at least 3/5 (all limited d/t R hip pain) RLE: Unable to fully assess due to pain LLE Deficits / Details: strength and ROM WFL       Communication   Communication: HOH  Cognition Arousal/Alertness: Awake/alert Behavior During Therapy: WFL for tasks assessed/performed Overall Cognitive Status:  (Oriented to person but not situation, place, or time)                                 General Comments: Pt's daughter reports decline in cognition last week.      General Comments General comments (skin integrity, edema, etc.): Pt's daughter present for part of session (left for functional mobility part of session).  Nursing cleared pt for participation in physical therapy.  Pt and pt's daughter agreeable to PT session.    Exercises Total Joint Exercises Ankle Circles/Pumps: AROM;Strengthening;Both;10 reps;Supine Quad Sets: AROM;Strengthening;Both;10 reps;Supine Short Arc Quad: AAROM;Strengthening;Both;10 reps;Supine Heel Slides: AAROM;Strengthening;Both;10 reps;Supine Hip ABduction/ADduction: AAROM;Strengthening;Both;10 reps;Supine  Vc's and tactile cues required for above ex's.   Assessment/Plan    PT Assessment Patient needs continued PT services  PT Problem List Decreased strength;Decreased activity tolerance;Decreased balance;Decreased mobility;Decreased cognition;Decreased knowledge of use of DME;Decreased knowledge of precautions;Pain       PT Treatment Interventions DME instruction;Gait training;Functional mobility  training;Therapeutic activities;Therapeutic exercise;Balance training;Patient/family education    PT Goals (Current goals can be found in the Care Plan section)  Acute Rehab PT Goals Patient Stated Goal: to be able to walk again PT Goal Formulation: With patient/family Time For Goal Achievement: 06/08/17 Potential to Achieve Goals: Good    Frequency BID   Barriers to discharge Decreased caregiver support      Co-evaluation               AM-PAC PT "6 Clicks" Daily Activity  Outcome Measure Difficulty turning over in bed (including adjusting bedclothes, sheets and blankets)?: Total Difficulty moving from lying on back to sitting on the side of the bed? : Total Difficulty sitting down on and standing up from a chair with arms (e.g., wheelchair, bedside commode, etc,.)?: Total Help needed moving to and from a bed to chair (including a wheelchair)?: A Lot Help needed walking in hospital room?: Total Help needed climbing 3-5 steps with a railing? : Total 6 Click Score: 7    End of Session Equipment Utilized During Treatment: Gait belt Activity Tolerance: Patient limited by pain Patient left: in chair;with call bell/phone within reach;with chair alarm set;with family/visitor present;with SCD's reapplied (B heels elevated via pillows) Nurse Communication: Mobility status;Precautions;Weight bearing status (via white board;  NA notified of assist levels) PT Visit Diagnosis: Muscle weakness (generalized) (M62.81);Difficulty in walking, not elsewhere classified (R26.2);Pain Pain - Right/Left: Right Pain - part of body: Hip    Time: 1610-9604 PT Time Calculation (min) (ACUTE ONLY): 47 min   Charges:   PT Evaluation $PT Eval Low Complexity: 1 Procedure PT Treatments $Therapeutic Exercise: 8-22 mins $Therapeutic Activity: 8-22 mins   PT G CodesHendricks Limes, PT 05/25/17, 11:14 AM [phone]

## 2017-05-25 NOTE — Progress Notes (Signed)
Physical Therapy Treatment Patient Details Name: Spencer Little MRN: [id number] DOB: 1924/09/08 Today's Date: 05/25/2017    History of Present Illness Pt is a 83 y.o. male s/p fall weeding his garden sustaining a comminuted R peritrochanteric/subtrochanteric femur fx 05/21/17 (surgery delayed d/t elevated INR).  Pt s/p IMN 05/24/17.  PMH includes dementia, a-fib on Coumadin, DM, htn.    PT Comments    Pt had returned to bed with nursing for CT scan.  Participated in exercises as described below.  General discomfort with exercised but overall tolerated well.   Follow Up Recommendations  SNF     Equipment Recommendations  Rolling walker with 5" wheels    Recommendations for Other Services       Precautions / Restrictions Precautions Precautions: Fall Restrictions Weight Bearing Restrictions: Yes RLE Weight Bearing: Weight bearing as tolerated    Mobility  Bed Mobility Overal bed mobility: Needs Assistance Bed Mobility: Supine to Sit     Supine to sit: Max assist;HOB elevated     General bed mobility comments: vc's for technique; assist for trunk and R>L LE; increased time and effort to perform  Transfers Overall transfer level: Needs assistance Equipment used: Rolling walker (2 wheeled) Transfers: Sit to/from [employer] Sit to Stand: Max assist;From elevated surface   Squat pivot transfers: Max assist     General transfer comment: Bed height elevated a couple inches; max assist to come to 3/4th stand with RW (pt unable to come to full upright position and required max assist to control descent back to sitting on edge of bed); max assist squat/stand pivot to L bed to chair with vc's for hand and feet placement and technique  Ambulation/Gait             General Gait Details: Not appropriate at this time d/t pt's difficulty with standing with RW   Stairs            Wheelchair Mobility    Modified Rankin (Stroke Patients Only)        Balance Overall balance assessment: Needs assistance Sitting-balance support: Bilateral upper extremity supported;Feet supported Sitting balance-Leahy Scale: Poor Sitting balance - Comments: pt leaning to L (d/t R hip pain) but able to correct with vc's for hand positioning (and holding onto lower bed rail with R UE) Postural control: Left lateral lean                                  Cognition Arousal/Alertness: Awake/alert Behavior During Therapy: WFL for tasks assessed/performed Overall Cognitive Status: Within Functional Limits for tasks assessed                                 General Comments: Pt's daughter reports decline in cognition last week.      Exercises Total Joint Exercises Ankle Circles/Pumps: AROM;Strengthening;Both;10 reps;Supine Quad Sets: AROM;Strengthening;Both;10 reps;Supine Short Arc Quad: AAROM;Strengthening;Both;10 reps;Supine Heel Slides: AAROM;Strengthening;Both;10 reps;Supine Hip ABduction/ADduction: AAROM;Strengthening;Both;10 reps;Supine Other Exercises Other Exercises: BLE AAROM ankle pumps, heel slides, SLR, ab/add and SAQ in supine    General Comments General comments (skin integrity, edema, etc.): Pt's daughter present for part of session (left for functional mobility part of session).      Pertinent Vitals/Pain Pain Assessment: Faces Faces Pain Scale: Hurts little more Pain Location: R hip Pain Descriptors / Indicators: Grimacing;Guarding;Sore Pain Intervention(s): Limited activity within patient's tolerance  Home Living Family/patient expects to be discharged to:: Private residence Living Arrangements: Spouse/significant other ([facility]) Available Help at Discharge: Family;Personal care attendant Type of Home: House Home Access: Ramped entrance   Home Layout: One level Home Equipment: Environmental consultantWalker - 2 wheels;Walker - 4 wheels;Grab bars - tub/shower;Bedside commode (Wife currently  using RW)      Prior Function Level of Independence: Independent      Comments: Pt's daughter reports pt with "unstable walking" last 6 months and pt was encouraged to use walker but pt tended not to use any assistive device with walker.  No reported falls in past 6 months.  Pt's daughter reports pt has 24/7 supervision.   PT Goals (current goals can now be found in the care plan section) Acute Rehab PT Goals Patient Stated Goal: to be able to walk again PT Goal Formulation: With patient/family Time For Goal Achievement: 06/08/17 Potential to Achieve Goals: Good    Frequency    BID      PT Plan Current plan remains appropriate    Co-evaluation              AM-PAC PT "6 Clicks" Daily Activity  Outcome Measure  Difficulty turning over in bed (including adjusting bedclothes, sheets and blankets)?: Total Difficulty moving from lying on back to sitting on the side of the bed? : Total Difficulty sitting down on and standing up from a chair with arms (e.g., wheelchair, bedside commode, etc,.)?: Total Help needed moving to and from a bed to chair (including a wheelchair)?: A Lot Help needed walking in hospital room?: Total Help needed climbing 3-5 steps with a railing? : Total 6 Click Score: 7    End of Session Equipment Utilized During Treatment: Gait belt Activity Tolerance: Patient tolerated treatment well Patient left: in bed;with bed alarm set;with call bell/phone within reach;with family/visitor present Nurse Communication: Mobility status;Precautions;Weight bearing status (via white board; NA notified of assist levels) PT Visit Diagnosis: Muscle weakness (generalized) (M62.81);Difficulty in walking, not elsewhere classified (R26.2);Pain Pain - Right/Left: Right Pain - part of body: Hip     Time: 1252-1307 PT Time Calculation (min) (ACUTE ONLY): 15 min  Charges:  $Therapeutic Exercise: 8-22 mins $Therapeutic Activity: 8-22 mins                    G Codes:        Danielle DessSarah Rockwell Zentz, PTA 05/25/17, 1:19 PM

## 2017-05-26 LAB — GLUCOSE, CAPILLARY
GLUCOSE-CAPILLARY: 142 mg/dL — AB (ref 65–99)
Glucose-Capillary: 190 mg/dL — ABNORMAL HIGH (ref 65–99)
Glucose-Capillary: 164 mg/dL — ABNORMAL HIGH (ref 65–99)
Glucose-Capillary: 132 mg/dL — ABNORMAL HIGH (ref 65–99)

## 2017-05-26 LAB — CBC
HEMATOCRIT: 24.6 % — AB (ref 40.0–52.0)
HEMOGLOBIN: 8.4 g/dL — AB (ref 13.0–18.0)
MCH: 32.7 pg (ref 26.0–34.0)
MCHC: 34 g/dL (ref 32.0–36.0)
MCV: 96.2 fL (ref 80.0–100.0)
Platelets: 199 10*3/uL (ref 150–440)
RBC: 2.56 MIL/uL — ABNORMAL LOW (ref 4.40–5.90)
RDW: 14.5 % (ref 11.5–14.5)
WBC: 9.8 10*3/uL (ref 3.8–10.6)

## 2017-05-26 LAB — BASIC METABOLIC PANEL
Anion gap: 7 (ref 5–15)
BUN: 43 mg/dL — AB (ref 6–20)
CHLORIDE: 114 mmol/L — AB (ref 101–111)
CO2: 25 mmol/L (ref 22–32)
CREATININE: 0.82 mg/dL (ref 0.61–1.24)
Calcium: 9 mg/dL (ref 8.9–10.3)
GFR calc Af Amer: >60 mL/min (ref >60)
GFR calc non Af Amer: >60 mL/min (ref >60)
Glucose, Bld: 213 mg/dL — ABNORMAL HIGH (ref 65–99)
Potassium: 3.1 mmol/L — ABNORMAL LOW (ref 3.5–5.1)
Sodium: 146 mmol/L — ABNORMAL HIGH (ref 135–145)

## 2017-05-26 LAB — PROTIME-INR
INR: 1.74
Prothrombin Time: 20.6 seconds — ABNORMAL HIGH (ref 11.4–15.2)

## 2017-05-26 MED ORDER — POTASSIUM CHLORIDE IN NACL 20-0.45 MEQ/L-% IV SOLN
INTRAVENOUS | Status: DC
  Administered 2017-05-26 – 2017-05-27 (×2): via INTRAVENOUS
  Filled 2017-05-26 (×3): qty 1000

## 2017-05-26 MED ORDER — POTASSIUM CHLORIDE 10 MEQ/100ML IV SOLN
10.0000 meq | INTRAVENOUS | Status: AC
  Administered 2017-05-26 (×4): 10 meq via INTRAVENOUS
  Filled 2017-05-26 (×4): qty 100

## 2017-05-26 MED ORDER — POTASSIUM CHLORIDE IN NACL 20-0.9 MEQ/L-% IV SOLN
INTRAVENOUS | Status: DC
  Filled 2017-05-26: qty 1000

## 2017-05-26 MED ORDER — POTASSIUM CHLORIDE 20 MEQ PO PACK
20.0000 meq | PACK | Freq: Two times a day (BID) | ORAL | Status: DC
  Filled 2017-05-26: qty 1

## 2017-05-26 NOTE — Progress Notes (Signed)
Patient having difficulty swallowing. RN crushed meds and put in apple sauce per MD order and patient had coughing spell afterwards. MD notified, and will place orders.   Harvie HeckMelanie Daxen Lanum, RN

## 2017-05-26 NOTE — Progress Notes (Signed)
Foley catheter removed at 1000. Patient had been pulling on catheter and was getting some drainage. RN deflated catheter and removed. MD notified and discontinued urology order.   Harvie HeckMelanie Monserrate Blaschke, RN

## 2017-05-26 NOTE — Progress Notes (Signed)
Upon Nursing round to check on patient, patient had removed IV, removed dressing from surgical site, and disconnected foley catheter tubing. Catheter remains in place and intact, tubing reconnected. New dressing to surgical site. New IV placed. Safety mitts placed on patient. Nursing continues to monitor and assist patient as needed.

## 2017-05-26 NOTE — Progress Notes (Addendum)
[facility] [place] [facility]   PATIENT NAME: Spencer Little    MR#:  [id number]  DATE OF BIRTH:  1924/07/26  SUBJECTIVE:  CHIEF COMPLAINT:   Chief Complaint  Patient presents with  . Leg Injury    right   - Postoperative day 2 after right hip fracture surgery, confused and has dementia at baseline - Difficulty swallowing noted yesterday- coughing with meds- kept NPO  REVIEW OF SYSTEMS:  Review of Systems  Unable to perform ROS: Dementia    DRUG ALLERGIES:   Allergies  Allergen Reactions  . Penicillins     VITALS:  Blood pressure (!) 155/70, pulse 78, temperature 98.1 F (36.7 C), temperature source Oral, resp. rate 16, height 6\' 1"  (1.854 m), weight 74.8 kg (165 lb), SpO2 98 %.  PHYSICAL EXAMINATION:  Physical Exam  GENERAL:  83 y.o.-year-old patient sitting in the recliner with no acute distress. Confused EYES: Pupils equal, round, reactive to light and accommodation. No scleral icterus. Extraocular muscles intact.  HEENT: Head atraumatic, normocephalic. Oropharynx and nasopharynx clear. Dry mucus membranes NECK:  Supple, no jugular venous distention. No thyroid enlargement, no tenderness.  LUNGS: Normal breath sounds bilaterally, no wheezing, rales,rhonchi or crepitation. No use of accessory muscles of respiration. Decreased bibasilar breath sounds CARDIOVASCULAR: S1, S2 normal. No  rubs, or gallops. 2/6 systolic murmur present ABDOMEN: Soft, nontender, nondistended. Bowel sounds present. No organomegaly or mass.  EXTREMITIES: No pedal edema, cyanosis, or clubbing.  NEUROLOGIC: Cranial nerves II through XII are intact. Muscle strength 5/5 in all extremities except right leg due to fracture. Right hip is abducted, externally rotated.dressing in place. Sensation intact. Gait not checked.  PSYCHIATRIC: The patient is alert and oriented to self, no short term memory, confused.  SKIN: No obvious rash, lesion, or ulcer. Varicose veins noted all  over the right leg, engorged as well   LABORATORY PANEL:   CBC  Recent Labs Lab 05/26/17 0703  WBC 9.8  HGB 8.4*  HCT 24.6*  PLT 199   ------------------------------------------------------------------------------------------------------------------  Chemistries   Recent Labs Lab 05/21/17 1348 05/22/17 0444  05/26/17 0703  NA 139 136  < > 146*  K 4.8 3.3*  < > 3.1*  CL 103 98*  < > 114*  CO2 29 29  < > 25  GLUCOSE 170* 148*  < > 213*  BUN 20 25*  < > 43*  CREATININE 0.88 0.94  < > 0.82  CALCIUM 9.2 8.9  < > 9.0  MG  --  2.0  --   --   AST 31  --   --   --   ALT 17  --   --   --   ALKPHOS 66  --   --   --   BILITOT 1.0  --   --   --   < > = values in this interval not displayed. ------------------------------------------------------------------------------------------------------------------  Cardiac Enzymes No results for input(s): TROPONINI in the last 168 hours. ------------------------------------------------------------------------------------------------------------------  RADIOLOGY:  Ct Head Wo Contrast  Result Date: 05/25/2017 CLINICAL DATA:  Dysphagia and weakness. EXAM: CT HEAD WITHOUT CONTRAST TECHNIQUE: Contiguous axial images were obtained from the base of the skull through the vertex without intravenous contrast. COMPARISON:  05/21/2017 and 09/07/2015 head CTs FINDINGS: Brain: No evidence of acute infarction, hemorrhage, hydrocephalus, extra-axial collection or mass lesion/mass effect. Moderate atrophy and chronic small-vessel white matter ischemic changes again noted. Vascular: No hyperdense vessel or unexpected calcification. Skull: Normal. Negative for fracture or focal  lesion. Sinuses/Orbits: No acute finding. Other: None. IMPRESSION: No evidence of acute intracranial abnormality. Atrophy and chronic small-vessel white matter ischemic changes. Electronically Signed   By: Harmon Pier M.D.   On: 05/25/2017 12:02   US Thyroid  Result Date:  05/25/2017 CLINICAL DATA:  Goiter. EXAM: THYROID ULTRASOUND TECHNIQUE: Ultrasound examination of the thyroid gland and adjacent soft tissues was performed. COMPARISON:  06/24/2012 FINDINGS: Parenchymal Echotexture: Mildly heterogenous Isthmus: Normal in size measures 0.4 cm in diameter, unchanged Right lobe: Normal in size measuring 4.9 x 2.6 x 2.5 cm, unchanged, previously, 4.4 x 2.4 x 2.5 cm Left lobe: Normal in size measuring 4.1 x 1.5 x 2.1 cm, unchanged, previously, 4.2 x 2.3 x 2.1 cm _________________________________________________________ Estimated total number of nodules >/= 1 cm: 3 Number of spongiform nodules >/=  2 cm not described below (TR1): 0 Number of mixed cystic and solid nodules >/= 1.5 cm not described below (TR2): 0 _________________________________________________________ Nodule # 2: Location: Right; Inferior - this nodule is not definitely seen on the prior examination Maximum size: 2.0 cm; Other 2 dimensions: 2.0 x 1.7 cm Composition: solid/almost completely solid (2) Echogenicity: hyperechoic (1) Shape: taller-than-wide (3) Margins: ill-defined (0) Echogenic foci: none (0) ACR TI-RADS total points: 6. ACR TI-RADS risk category: TR4 (4-6 points). ACR TI-RADS recommendations: **Given size (>/= 1.5 cm) and appearance, fine needle aspiration of this moderately suspicious nodule should be considered based on TI-RADS criteria. _________________________________________________________ Apparent development of an approximately 1.9 x 1.6 x 2.5 cm spongiform appearing nodule within the superior pole the left lobe of thyroid - despite this nodule's apparent interval development, given its spongiform and benign appearance, this nodule does not meet imaging criteria to recommend percutaneous sampling or dedicated follow-up. There is a predominantly cystic approximately 2.1 x 1.3 x 2.2 cm nodule within the superior pole of the right lobe of the thyroid which appears grossly unchanged compared to the  06/2012 examination, previously, 1.9 x 1.6 x 2.1 cm, with slight differences likely total to scan plane projection. Given stability for nearly 5 years, this nodule of his favored to be of benign etiology. IMPRESSION: 1. Findings most suggestive of multinodular goiter. 2. Apparent development of an approximately 2.0 cm nodule within the inferior pole of the right lobe of the thyroid - while this nodule technically meets imaging criteria to recommend percutaneous sampling, considering patient's advanced age, an additional imaging strategy could entail a 1 year surveillance as clinically indicated. 3. The remaining thyroid nodules do not meet imaging criteria to recommend percutaneous sampling or continued dedicated follow-up. Electronically Signed   By: Simonne Come M.D.   On: 05/25/2017 12:56   Dg Hip Operative Unilat With Pelvis Right  Result Date: 05/24/2017 CLINICAL DATA:  Status post hip fracture repair EXAM: OPERATIVE RIGHT HIP (WITH PELVIS IF PERFORMED) 3 VIEWS TECHNIQUE: Fluoroscopic spot image(s) were submitted for interpretation post-operatively. COMPARISON:  May 21, 2017 FINDINGS: A rod and gamma nail now extend across the right hip fracture with a proximal cerclage wire and a distal interlocking screw. IMPRESSION: Right hip fracture repair as above. Electronically Signed   By: Gerome Sam III M.D   On: 05/24/2017 13:35    EKG:   Orders placed or performed during the hospital encounter of 05/21/17  . EKG 12-Lead  . EKG 12-Lead  . ED EKG  . ED EKG    ASSESSMENT AND PLAN:   Spencer Little  is a 83 y.o. male with a known history of Atrial fibrillation on Coumadin, moderate to  severe dementia due to Alzheimer's and vascular dementia, non-insulin-dependent diabetes mellitus presents to the hospital secondary to a fall and right hip fracture.  #1 Right hip fracture- due to mechanical fall - orthopedics consulted,Status post surgery and postop day 2 today. - Pain management. Physical  therapy consulted- needs SNF. -incentive spirometry, Urine analysis was normal on admission.  #2 hypertension-continue lisinopril, Norvasc. Hold Lasix.   #3 Dysphagia-no trouble swallowing food at baseline, however noted to have dysphagia yesterday. -CT of the head negative for  stroke especially since patient has been off of his Coumadin prior to surgery. -Also thyroid ultrasound with multinodular goitre but no mechanical compression.  -Speech therapy recommended nothing by mouth at this time and modified barium swallow study on Monday.  #4 dementia-follows with neurology as outpatient. Continue Aricept and Namenda. Conversational and oriented to self at baseline.  #5 atrial fibrillation-chronic, stable. Heart rate on the lower side, so not on any rate limiting medications. ECHO done -Coumadin to be restarted now.  #6 Anemia- acute on chronic post op anemia- hemoglobin levels dropped from 11-8.5 today. -Continue to monitor closely especially patient on Coumadin at this time. - No acute indication for transfusion at this time, not symptomatic. Transfuse if hemoglobin is less than 7 or patient becomes symptomatic  #7 DVT prophylaxis-Coumadin restarted, INR at 1.7 today. Discontinue Lasix today and follow up INR tomorrow. Pharmacy to adjust Coumadin for therapeutic levels   Will need rehabilitation once his swallowing is addressed    All the records are reviewed and case discussed with Care Management/Social Workerr. Management plans discussed with the patient, family and they are in agreement.  CODE STATUS: Full code  TOTAL TIME TAKING CARE OF THIS PATIENT: 38 minutes.   POSSIBLE D/C IN 2-3 DAYS, DEPENDING ON CLINICAL CONDITION.   Enid Baas M.D on 05/26/2017 at 11:42 AM  Between 7am to 6pm - Pager - [phone]  After 6pm go to [email or web address] - Social research officer, government  [facility]  Office  [phone]  CC: Primary care physician; Veneda Melter, FNP

## 2017-05-26 NOTE — Progress Notes (Signed)
Patient alert and oriented X1. Safety mittens in place. Daughter at bedside. Patient receiving IV potassium. Difficulty swallowing, patient will remain NPO until tomorrow with possible barium swallow test.   Harvie HeckMelanie Arleatha Philipps, RN

## 2017-05-26 NOTE — Progress Notes (Signed)
Physical Therapy Treatment Patient Details Name: Spencer Little MRN: [id number] DOB: 06-06-1924 Today's Date: 05/26/2017    History of Present Illness Pt is a 83 y.o. male s/p fall weeding his garden sustaining a comminuted R peritrochanteric/subtrochanteric femur fx 05/21/17 (surgery delayed d/t elevated INR).  Pt s/p IMN 05/24/17.  PMH includes dementia, a-fib on Coumadin, DM, htn.    PT Comments    Pt awake and ready for session.  Participated in exercises as described below.  Pt noted to be leaking urine with catheter.  Nursing called to address. To edge of bed with max a x 2 with trunk extension.  Pt required max a x 2 and heavy curing to flex trunk to allow sitting upright on edge of bed.  He was unable to maintain sitting balance without mod a x 1.  Stand pivot transfer to recliner at bedside with max a x 2 with poor assistance from pt today.  Overall pt required increased assist with mobility skills vs prior session.  Discussed with primary PT.   Follow Up Recommendations  SNF     Equipment Recommendations       Recommendations for Other Services       Precautions / Restrictions Precautions Precautions: Fall Restrictions Weight Bearing Restrictions: Yes RLE Weight Bearing: Weight bearing as tolerated    Mobility  Bed Mobility Overal bed mobility: Needs Assistance Bed Mobility: Supine to Sit     Supine to sit: Max assist;+2 for physical assistance     General bed mobility comments: increased assist today, resisting trunk flexion initially causing his to lay sideways across bed.  Transfers Overall transfer level: Needs assistance Equipment used: None Transfers: Stand Pivot Transfers Sit to Stand: Max assist;+2 physical assistance   Squat pivot transfers: Max assist;+2 physical assistance     General transfer comment: deferred due to pt fatigue/safety  Ambulation/Gait             General Gait Details: Not appropriate at this time   Stairs             Wheelchair Mobility    Modified Rankin (Stroke Patients Only)       Balance Overall balance assessment: Needs assistance;History of Falls Sitting-balance support: Bilateral upper extremity supported;Feet supported Sitting balance-Leahy Scale: Poor   Postural control: Left lateral lean Standing balance support: Bilateral upper extremity supported Standing balance-Leahy Scale: Zero                              Cognition Arousal/Alertness: Awake/alert Behavior During Therapy: WFL for tasks assessed/performed Overall Cognitive Status: History of cognitive impairments - at baseline                                        Exercises Total Joint Exercises Ankle Circles/Pumps: AROM;Strengthening;Both;10 reps;Supine Quad Sets: AROM;Strengthening;Both;10 reps;Supine Short Arc Quad: AAROM;Strengthening;Both;10 reps;Supine Heel Slides: AAROM;Strengthening;Both;10 reps;Supine Hip ABduction/ADduction: AAROM;Strengthening;Both;10 reps;Supine Straight Leg Raises: AAROM;Strengthening;Supine;Both;10 reps    General Comments        Pertinent Vitals/Pain Pain Assessment: Faces Faces Pain Scale: Hurts little more Pain Location: R hip Pain Descriptors / Indicators: Grimacing;Guarding;Sore Pain Intervention(s): Limited activity within patient's tolerance;Repositioned    Home Living                      Prior Function  PT Goals (current goals can now be found in the care plan section) Progress towards PT goals: Progressing toward goals    Frequency    BID      PT Plan Current plan remains appropriate    Co-evaluation              AM-PAC PT "6 Clicks" Daily Activity  Outcome Measure  Difficulty turning over in bed (including adjusting bedclothes, sheets and blankets)?: Total Difficulty moving from lying on back to sitting on the side of the bed? : Total Difficulty sitting down on and standing up from a chair with  arms (e.g., wheelchair, bedside commode, etc,.)?: Total Help needed moving to and from a bed to chair (including a wheelchair)?: Total Help needed walking in hospital room?: Total Help needed climbing 3-5 steps with a railing? : Total 6 Click Score: 6    End of Session Equipment Utilized During Treatment: Gait belt Activity Tolerance: Patient tolerated treatment well Patient left: with call bell/phone within reach;with family/visitor present;in chair;with chair alarm set Nurse Communication: Mobility status Pain - Right/Left: Right Pain - part of body: Hip     Time: [id number] PT Time Calculation (min) (ACUTE ONLY): 27 min  Charges:  $Therapeutic Exercise: 8-22 mins $Therapeutic Activity: 8-22 mins                    G Codes:       Danielle DessSarah Andreika Vandagriff, PTA 05/26/17, 10:42 AM

## 2017-05-26 NOTE — Progress Notes (Signed)
  Subjective: 2 Days Post-Op Procedure(s) (LRB): INTRAMEDULLARY (IM) NAIL FEMORAL (N/A) Patient reports pain as mild.   Patient is well but has a history of dementia Plan is to go Skilled nursing facility after hospital stay. Negative for chest pain and shortness of breath Fever: no Gastrointestinal:Negative for nausea and vomiting Pt removed IV and pulled off surgical dressings last night, placed in safety mitts.  Objective: Vital signs in last 24 hours: Temp:  [97.8 F (36.6 C)-98.6 F (37 C)] 98.1 F (36.7 C) (06/10 0730) Pulse Rate:  [78-91] 78 (06/10 0730) Resp:  [16-19] 16 (06/10 0730) BP: (133-155)/(70-90) 155/70 (06/10 0730) SpO2:  [97 %-100 %] 98 % (06/10 0730)  Intake/Output from previous day:  Intake/Output Summary (Last 24 hours) at 05/26/17 0829 Last data filed at 05/26/17 0800  Gross per 24 hour  Intake             2100 ml  Output              325 ml  Net             1775 ml    Intake/Output this shift: Total I/O In: 300 [I.V.:300] Out: -   Labs:  Recent Labs  05/24/17 0435 05/25/17 0310 05/26/17 0703  HGB 10.3* 8.5* 8.4*    Recent Labs  05/25/17 0310 05/26/17 0703  WBC 9.8 9.8  RBC 2.58* 2.56*  HCT 24.8* 24.6*  PLT 174 199    Recent Labs  05/25/17 0310 05/26/17 0703  NA 142 146*  K 3.6 3.1*  CL 110 114*  CO2 24 25  BUN 36* 43*  CREATININE 0.70 0.82  GLUCOSE 169* 213*  CALCIUM 8.5* 9.0    Recent Labs  05/25/17 0752 05/26/17 0703  INR 1.61 1.74     EXAM General - Patient is Disorganized, Confused and Lacking Extremity - ABD soft Sensation intact distally Intact pulses distally Dorsiflexion/Plantar flexion intact Incision: dressing C/D/I No cellulitis present Dressing/Incision - Clear, dry, no drainage present. Motor Function - intact, moving foot and toes well on exam.  Past Medical History:  Diagnosis Date  . Atrial fibrillation (HCC)   . 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  Diabetes (HCC)   . Hypertension    Assessment/Plan: 2 Days Post-Op Procedure(s) (LRB): INTRAMEDULLARY (IM) NAIL FEMORAL (N/A) Active Problems:   Hip fracture (HCC)   Pressure injury of skin  Estimated body mass index is 21.77 kg/m as calculated from the following:   Height as of this encounter: 6\' 1"  (1.854 m).   Weight as of this encounter: 74.8 kg (165 lb). Advance diet Up with therapy D/C IV fluids when tolerating po intake.  Labs reviewed, Hg 8.4.  Continue to monitor K+ 3.1 this AM, will supplement. Continue with safety mitts at this time. Up with therapy today, will likely need SNF placement upon discharge. Daily INR checks. INR 1.74 at this time, continue on warfarin and Lovenox until therapeutic.  DVT Prophylaxis - Lovenox, Coumadin, Foot Pumps and TED hose Weight-Bearing as tolerated to right leg  J. Horris LatinoLance Tejal Monroy, PA-C [facility] 05/26/2017, 8:29 AM

## 2017-05-27 ENCOUNTER — Inpatient Hospital Stay: Payer: Medicare Other

## 2017-05-27 LAB — GLUCOSE, CAPILLARY
GLUCOSE-CAPILLARY: 141 mg/dL — AB (ref 65–99)
Glucose-Capillary: 122 mg/dL — ABNORMAL HIGH (ref 65–99)
Glucose-Capillary: 133 mg/dL — ABNORMAL HIGH (ref 65–99)
Glucose-Capillary: 145 mg/dL — ABNORMAL HIGH (ref 65–99)
Glucose-Capillary: 145 mg/dL — ABNORMAL HIGH (ref 65–99)

## 2017-05-27 LAB — CBC
HEMATOCRIT: 23.4 % — AB (ref 40.0–52.0)
HEMOGLOBIN: 8.1 g/dL — AB (ref 13.0–18.0)
MCH: 33.3 pg (ref 26.0–34.0)
MCHC: 34.5 g/dL (ref 32.0–36.0)
MCV: 96.3 fL (ref 80.0–100.0)
Platelets: 213 10*3/uL (ref 150–440)
RBC: 2.43 MIL/uL — ABNORMAL LOW (ref 4.40–5.90)
RDW: 14.3 % (ref 11.5–14.5)
WBC: 9.5 10*3/uL (ref 3.8–10.6)

## 2017-05-27 LAB — BASIC METABOLIC PANEL
Anion gap: 5 (ref 5–15)
BUN: 41 mg/dL — AB (ref 6–20)
CHLORIDE: 116 mmol/L — AB (ref 101–111)
CO2: 26 mmol/L (ref 22–32)
CREATININE: 0.64 mg/dL (ref 0.61–1.24)
Calcium: 8.9 mg/dL (ref 8.9–10.3)
GFR calc Af Amer: >60 mL/min (ref >60)
GFR calc non Af Amer: >60 mL/min (ref >60)
Glucose, Bld: 135 mg/dL — ABNORMAL HIGH (ref 65–99)
Potassium: 3.8 mmol/L (ref 3.5–5.1)
Sodium: 147 mmol/L — ABNORMAL HIGH (ref 135–145)

## 2017-05-27 LAB — PROTIME-INR
INR: 1.61
Prothrombin Time: 19.3 seconds — ABNORMAL HIGH (ref 11.4–15.2)

## 2017-05-27 MED ORDER — SODIUM CHLORIDE 0.45 % IV SOLN
Freq: Once | INTRAVENOUS | Status: AC
  Administered 2017-05-27: 08:00:00 via INTRAVENOUS

## 2017-05-27 NOTE — Progress Notes (Signed)
[facility] [place] [facility]   PATIENT NAME: Spencer Little    MR#:  [id number]  DATE OF BIRTH:  11-Dec-1924  SUBJECTIVE:   - Postoperative day 3 after right hip fracture surgery, confused and has dementia at baseline - Difficulty swallowing noted yesterday- coughing with meds- kept NPO  REVIEW OF SYSTEMS:  Review of Systems  Unable to perform ROS: Dementia    DRUG ALLERGIES:   Allergies  Allergen Reactions  . Penicillins     VITALS:  Blood pressure (!) 142/86, pulse 90, temperature 98.6 F (37 C), temperature source Oral, resp. rate 18, height 6\' 1"  (1.854 m), weight 74.8 kg (165 lb), SpO2 97 %.  PHYSICAL EXAMINATION:  Physical Exam  GENERAL:  83 y.o.-year-old patient sitting in the recliner with no acute distress. Confused EYES: Pupils equal, round, reactive to light and accommodation. No scleral icterus. Extraocular muscles intact.  HEENT: Head atraumatic, normocephalic. Oropharynx and nasopharynx clear. Dry mucus membranes NECK:  Supple, no jugular venous distention. No thyroid enlargement, no tenderness.  LUNGS: Normal breath sounds bilaterally, no wheezing, rales,rhonchi or crepitation. No use of accessory muscles of respiration. Decreased bibasilar breath sounds CARDIOVASCULAR: S1, S2 normal. No  rubs, or gallops. 2/6 systolic murmur present ABDOMEN: Soft, nontender, nondistended. Bowel sounds present. No organomegaly or mass.  EXTREMITIES: No pedal edema, cyanosis, or clubbing. Mitts++ NEUROLOGIC: Cranial nerves II through XII are intact. Muscle strength 5/5 in all extremities except right leg due to fracture. Right hip is abducted, externally rotated.dressing in place. Sensation intact. Gait not checked.  PSYCHIATRIC: The patient is alert and oriented to self, no short term memory, confused.  SKIN: No obvious rash, lesion, or ulcer. Varicose veins noted all over the right leg, engorged as well   LABORATORY PANEL:   CBC  Recent  Labs Lab 05/27/17 0334  WBC 9.5  HGB 8.1*  HCT 23.4*  PLT 213   ------------------------------------------------------------------------------------------------------------------  Chemistries   Recent Labs Lab 05/21/17 1348 05/22/17 0444  05/27/17 0334  NA 139 136  < > 147*  K 4.8 3.3*  < > 3.8  CL 103 98*  < > 116*  CO2 29 29  < > 26  GLUCOSE 170* 148*  < > 135*  BUN 20 25*  < > 41*  CREATININE 0.88 0.94  < > 0.64  CALCIUM 9.2 8.9  < > 8.9  MG  --  2.0  --   --   AST 31  --   --   --   ALT 17  --   --   --   ALKPHOS 66  --   --   --   BILITOT 1.0  --   --   --   < > = values in this interval not displayed. ------------------------------------------------------------------------------------------------------------------  Cardiac Enzymes No results for input(s): TROPONINI in the last 168 hours. ------------------------------------------------------------------------------------------------------------------  RADIOLOGY:  No results found.  EKG:   Orders placed or performed during the hospital encounter of 05/21/17  . EKG 12-Lead  . EKG 12-Lead  . ED EKG  . ED EKG    ASSESSMENT AND PLAN:   Spencer Little  is a 83 y.o. male with a known history of Atrial fibrillation on Coumadin, moderate to severe dementia due to Alzheimer's and vascular dementia, non-insulin-dependent diabetes mellitus presents to the hospital secondary to a fall and right hip fracture.  #1 Right hip fracture- due to mechanical fall - orthopedics consulted,Status post surgery and postop day 3 today. -  Pain management. Physical therapy consulted- needs SNF. -incentive spirometry, Urine analysis was normal on admission.  #2 hypertension-continue lisinopril, Norvasc. Hold Lasix.   #3 Dysphagia-no trouble swallowing food at baseline, however noted to have dysphagia yesterday. -CT of the head negative for  stroke especially since patient has been off of his Coumadin prior to surgery. -Also  thyroid ultrasound with multinodular goitre but no mechanical compression.  -Speech therapy recommended nothing by mouth at this time and modified barium swallow study on Monday. -Discussed with speech therapist. Patient has an abnormal modified barium swallow. High risk for aspiration.  #4 dementia-follows with neurology as outpatient. Continue Aricept and Namenda. Conversational and oriented to self at baseline.  #5 atrial fibrillation-chronic, stable. Heart rate on the lower side, so not on any rate limiting medications. ECHO done -Coumadin to be restarted now.  #6 Anemia- acute on chronic post op anemia- hemoglobin levels dropped from 11-8.5 today. -Continue to monitor closely especially patient on Coumadin at this time. - No acute indication for transfusion at this time, not symptomatic. Transfuse if hemoglobin is less than 7 or patient becomes symptomatic  #7 DVT prophylaxis-Coumadin restarted, INR at 1.7 today. Discontinue Lasix today and follow up INR tomorrow. Pharmacy to adjust Coumadin for therapeutic levels   palliative care consult. Patient appears to be declining overall. Has overall long-term poor prognosis. He is at very high risk for aspiration and recurrent dehydration. Will need rehabilitation once his swallowing is addressed   All the records are reviewed and case discussed with Care Management/Social Workerr. Management plans discussed with the patient, family and they are in agreement.  CODE STATUS: Full code  TOTAL TIME TAKING CARE OF THIS PATIENT: 35 minutes.   POSSIBLE D/C IN 2-3 DAYS, DEPENDING ON CLINICAL CONDITION.   Deveney Bayon M.D on 05/27/2017 at 2:36 PM  Between 7am to 6pm - Pager - [phone]  After 6pm go to [email or web address] - Social research officer, government  [facility]  Office  [phone]  CC: Primary care physician; Veneda Melter, FNP

## 2017-05-27 NOTE — Progress Notes (Signed)
Per MD patient is not stable for D/C today and may be ready tomorrow. Plan is for [facility] to [facility]C [facility]. [facility] is aware of above. Clinical Social Worker (CSW) contacted patient's daughter Gavin PoundDeborah [phone] and made her aware of above. CSW will continue to follow and assist as needed.   B[employer] Hughes IncorporatedBailey Anavi Branscum, LCSW [phone]

## 2017-05-27 NOTE — Progress Notes (Signed)
Patient has been up most part of the shift , reaching for suff in the air and was calling for "honey" on numerous occasions. No acute distress noted or any s/s of pain seen. Patient fell asleep after receiving a bath, respirations even and unlabored. Dressing clean, dry and intact  to the right hip. Will continue to monitor.

## 2017-05-27 NOTE — Progress Notes (Signed)
[facility] [place] [facility]   PATIENT NAME: Spencer Little    MR#:  [id number]  DATE OF BIRTH:  12-Nov-1924  SUBJECTIVE:   - Postoperative day 3 after right hip fracture surgery, confused and has dementia at baseline - Difficulty swallowing noted yesterday- coughing with meds- kept NPO  REVIEW OF SYSTEMS:  Review of Systems  Unable to perform ROS: Dementia    DRUG ALLERGIES:   Allergies  Allergen Reactions  . Penicillins     VITALS:  Blood pressure (!) 142/86, pulse 90, temperature 98.6 F (37 C), temperature source Oral, resp. rate 18, height 6\' 1"  (1.854 m), weight 74.8 kg (165 lb), SpO2 97 %.  PHYSICAL EXAMINATION:  Physical Exam  GENERAL:  83 y.o.-year-old patient sitting in the recliner with no acute distress. Confused EYES: Pupils equal, round, reactive to light and accommodation. No scleral icterus. Extraocular muscles intact.  HEENT: Head atraumatic, normocephalic. Oropharynx and nasopharynx clear. Dry mucus membranes NECK:  Supple, no jugular venous distention. No thyroid enlargement, no tenderness.  LUNGS: Normal breath sounds bilaterally, no wheezing, rales,rhonchi or crepitation. No use of accessory muscles of respiration. Decreased bibasilar breath sounds CARDIOVASCULAR: S1, S2 normal. No  rubs, or gallops. 2/6 systolic murmur present ABDOMEN: Soft, nontender, nondistended. Bowel sounds present. No organomegaly or mass.  EXTREMITIES: No pedal edema, cyanosis, or clubbing.  NEUROLOGIC: Cranial nerves II through XII are intact. Muscle strength 5/5 in all extremities except right leg due to fracture. Right hip is abducted, externally rotated.dressing in place. Sensation intact. Gait not checked.  PSYCHIATRIC: The patient is alert and oriented to self, no short term memory, confused.  SKIN: No obvious rash, lesion, or ulcer. Varicose veins noted all over the right leg, engorged as well   LABORATORY PANEL:   CBC  Recent Labs Lab  05/27/17 0334  WBC 9.5  HGB 8.1*  HCT 23.4*  PLT 213   ------------------------------------------------------------------------------------------------------------------  Chemistries   Recent Labs Lab 05/21/17 1348 05/22/17 0444  05/27/17 0334  NA 139 136  < > 147*  K 4.8 3.3*  < > 3.8  CL 103 98*  < > 116*  CO2 29 29  < > 26  GLUCOSE 170* 148*  < > 135*  BUN 20 25*  < > 41*  CREATININE 0.88 0.94  < > 0.64  CALCIUM 9.2 8.9  < > 8.9  MG  --  2.0  --   --   AST 31  --   --   --   ALT 17  --   --   --   ALKPHOS 66  --   --   --   BILITOT 1.0  --   --   --   < > = values in this interval not displayed. ------------------------------------------------------------------------------------------------------------------  Cardiac Enzymes No results for input(s): TROPONINI in the last 168 hours. ------------------------------------------------------------------------------------------------------------------  RADIOLOGY:  Ct Head Wo Contrast  Result Date: 05/25/2017 CLINICAL DATA:  Dysphagia and weakness. EXAM: CT HEAD WITHOUT CONTRAST TECHNIQUE: Contiguous axial images were obtained from the base of the skull through the vertex without intravenous contrast. COMPARISON:  05/21/2017 and 09/07/2015 head CTs FINDINGS: Brain: No evidence of acute infarction, hemorrhage, hydrocephalus, extra-axial collection or mass lesion/mass effect. Moderate atrophy and chronic small-vessel white matter ischemic changes again noted. Vascular: No hyperdense vessel or unexpected calcification. Skull: Normal. Negative for fracture or focal lesion. Sinuses/Orbits: No acute finding. Other: None. IMPRESSION: No evidence of acute intracranial abnormality. Atrophy and chronic small-vessel white  matter ischemic changes. Electronically Signed   By: Harmon Pier M.D.   On: 05/25/2017 12:02   US Thyroid  Result Date: 05/25/2017 CLINICAL DATA:  Goiter. EXAM: THYROID ULTRASOUND TECHNIQUE: Ultrasound examination of the  thyroid gland and adjacent soft tissues was performed. COMPARISON:  06/24/2012 FINDINGS: Parenchymal Echotexture: Mildly heterogenous Isthmus: Normal in size measures 0.4 cm in diameter, unchanged Right lobe: Normal in size measuring 4.9 x 2.6 x 2.5 cm, unchanged, previously, 4.4 x 2.4 x 2.5 cm Left lobe: Normal in size measuring 4.1 x 1.5 x 2.1 cm, unchanged, previously, 4.2 x 2.3 x 2.1 cm _________________________________________________________ Estimated total number of nodules >/= 1 cm: 3 Number of spongiform nodules >/=  2 cm not described below (TR1): 0 Number of mixed cystic and solid nodules >/= 1.5 cm not described below (TR2): 0 _________________________________________________________ Nodule # 2: Location: Right; Inferior - this nodule is not definitely seen on the prior examination Maximum size: 2.0 cm; Other 2 dimensions: 2.0 x 1.7 cm Composition: solid/almost completely solid (2) Echogenicity: hyperechoic (1) Shape: taller-than-wide (3) Margins: ill-defined (0) Echogenic foci: none (0) ACR TI-RADS total points: 6. ACR TI-RADS risk category: TR4 (4-6 points). ACR TI-RADS recommendations: **Given size (>/= 1.5 cm) and appearance, fine needle aspiration of this moderately suspicious nodule should be considered based on TI-RADS criteria. _________________________________________________________ Apparent development of an approximately 1.9 x 1.6 x 2.5 cm spongiform appearing nodule within the superior pole the left lobe of thyroid - despite this nodule's apparent interval development, given its spongiform and benign appearance, this nodule does not meet imaging criteria to recommend percutaneous sampling or dedicated follow-up. There is a predominantly cystic approximately 2.1 x 1.3 x 2.2 cm nodule within the superior pole of the right lobe of the thyroid which appears grossly unchanged compared to the 06/2012 examination, previously, 1.9 x 1.6 x 2.1 cm, with slight differences likely total to scan plane  projection. Given stability for nearly 5 years, this nodule of his favored to be of benign etiology. IMPRESSION: 1. Findings most suggestive of multinodular goiter. 2. Apparent development of an approximately 2.0 cm nodule within the inferior pole of the right lobe of the thyroid - while this nodule technically meets imaging criteria to recommend percutaneous sampling, considering patient's advanced age, an additional imaging strategy could entail a 1 year surveillance as clinically indicated. 3. The remaining thyroid nodules do not meet imaging criteria to recommend percutaneous sampling or continued dedicated follow-up. Electronically Signed   By: Simonne Come M.D.   On: 05/25/2017 12:56    EKG:   Orders placed or performed during the hospital encounter of 05/21/17  . EKG 12-Lead  . EKG 12-Lead  . ED EKG  . ED EKG    ASSESSMENT AND PLAN:   Spencer Little  is a 83 y.o. male with a known history of Atrial fibrillation on Coumadin, moderate to severe dementia due to Alzheimer's and vascular dementia, non-insulin-dependent diabetes mellitus presents to the hospital secondary to a fall and right hip fracture.  #1 Right hip fracture- due to mechanical fall - orthopedics consulted,Status post surgery and postop day 3  today. - Pain management. Physical therapy consulted- needs SNF. -incentive spirometry, Urine analysis was normal on admission.  #2 hypertension-continue lisinopril, Norvasc. Hold Lasix.   #3 Dysphagia-no trouble swallowing food at baseline, however noted to have dysphagia yesterday. -CT of the head negative for  stroke especially since patient has been off of his Coumadin prior to surgery. -Also thyroid ultrasound shows multinodular goitre but no  mechanical compression.  -Speech therapy recommended nothing by mouth at this time and modified barium swallow study on Monday. -IVFluids for dehydration. Appears clinically dry  #4 dementia-follows with neurology as outpatient.  Continue Aricept and Namenda. Conversational and oriented to self at baseline.  #5 atrial fibrillation-chronic, stable. Heart rate on the lower side, so not on any rate limiting medications. ECHO done -Coumadin to be restarted now.  #6 Anemia- acute on chronic post op anemia- hemoglobin levels dropped from 11-8.5 today. -Continue to monitor closely especially patient on Coumadin at this time. - No acute indication for transfusion at this time, not symptomatic. Transfuse if hemoglobin is less than 7 or patient becomes symptomatic  #7 DVT prophylaxis-Coumadin restarted, INR at 1.7 today. Discontinue Lasix today and follow up INR tomorrow. Pharmacy to adjust Coumadin for therapeutic levels   Will need rehabilitation once his swallowing is addressed    All the records are reviewed and case discussed with Care Management/Social Workerr. Management plans discussed with the patient, family and they are in agreement.  CODE STATUS: Full code  TOTAL TIME TAKING CARE OF THIS PATIENT: 38 minutes.   POSSIBLE D/C IN 2-3 DAYS, DEPENDING ON CLINICAL CONDITION.   Brittne Kawasaki M.D on 05/27/2017 at 8:15 AM  Between 7am to 6pm - Pager - [phone]  After 6pm go to [email or web address] - Social research officer, government  [facility]  Office  [phone]  CC: Primary care physician; Veneda Melter, FNP

## 2017-05-27 NOTE — Op Note (Deleted)
05/24/2017 1:50 PM  Service: Orthopedics Editor: Kennedy BuckerMenz, Shajuan Musso, MD (Physician) Status: Addendum  [] Hide copied text [] Hover for attribution information 05/21/2017 - 05/24/2017  1:50 PM  PATIENT:  Spencer Little  83 y.o. male  PRE-OPERATIVE DIAGNOSIS:  right hip fracture subtrochanteric, reverse obliquity  POST-OPERATIVE DIAGNOSIS:  right hip fracture  PROCEDURE:  Procedure(s): INTRAMEDULLARY (IM) NAIL FEMORAL (N/A)  SURGEON: Leitha SchullerMichael J Reiley Bertagnolli, MD  ASSISTANTS: None  ANESTHESIA:   spinal  EBL:  Total I/O In: 1346 [I.V.:1050; Blood:296] Out: -   BLOOD ADMINISTERED:none  DRAINS: none   LOCAL MEDICATIONS USED:  NONE  SPECIMEN:  No Specimen  DISPOSITION OF SPECIMEN:  N/A  COUNTS:  YES  TOURNIQUET:  * No tourniquets in log *  IMPLANTS: Biomet affixes 9 x 4 4030 rod with 100 mm lag screw 54 mm interference screw, 1 cerclage wire  DICTATION: .Dragon Dictation patient brought the operating room and after adequate anesthesia was obtained and Foley placed, the patient was transferred to the fracture table where right leg was placed in the traction boot with traction applied left leg in the well-leg holder. C-arm was brought in and good alignment was obtained. After prepping and draping in sterile fashion appropriate patient identification and timeout procedure completed. This point the procedure was opening up the lateral femur with a lateral approach incising the IT band elevated and SL's placing a clamp around the fracture site to reduce the lateral spike. After this was accomplished was a some difficulty a wire was passed and tightened and crimped this gave good alignment and stability such that a rod could be placed. A small incision was made proximal to the greater trochanter and proximal drilling carried out after guidewire was inserted in the appropriate position and a guide were inserted down the canal. Measurement was made off of this to determine rod length.  The reaming was carried out to 11 mm which gave chatter and so 9 x 4 40 rod was inserted to the appropriate depth. A screw was placed into the head after first placing a guidewire measuring off of this drilling and then placing the screw this was tightened down is essentially a fixed angle device as it would not be compressing with a reverse obliquity. Next the traction was released and a distal interlocking screw was placed through the oblique hole first drilling and measurement and then the 54 mm screw. Permanent C-arm views were obtained the wounds were irrigated fibula was placed at the level of the vastus lateralis to minimize postoperative bleeding with him being on anticoagulation. The IT band was closed in a running #1 Vicryl 2-0 Vicryl subcutaneous and skin staples. Xeroform and honeycomb dressings applied  PLAN OF CARE: Continue as an inpatient  PATIENT DISPOSITION:  PACU - hemodynamically stable.

## 2017-05-27 NOTE — Care Management (Signed)
Tim with [facility] updated of plan for SNF/rehab at [facility] tomorrow.

## 2017-05-27 NOTE — Progress Notes (Signed)
Physical Therapy Treatment Patient Details Name: Spencer Little MRN: [id number] DOB: Dec 06, 1924 Today's Date: 05/27/2017    History of Present Illness Pt is a 83 y.o. male s/p fall weeding his garden sustaining a comminuted R peritrochanteric/subtrochanteric femur fx 05/21/17 (surgery delayed d/t elevated INR).  Pt s/p IMN 05/24/17.  PMH includes dementia, a-fib on Coumadin, DM, htn.    PT Comments    Pt agreeable to PT; does not participate in meaningful conversation to answer any questions. Pt does follow exercise instruction with increased verbal and tactile cueing. Pt lethargic and requires re awakening at several times to stay on task. Pt has several bouts of restlessness with incomprehensible verbalization.  Pt does complete exercises. Continue PT to progress strength and endurance to improve function.    Follow Up Recommendations  SNF     Equipment Recommendations  Rolling walker with 5" wheels    Recommendations for Other Services       Precautions / Restrictions Precautions Precautions: Fall Restrictions Weight Bearing Restrictions: Yes RLE Weight Bearing: Weight bearing as tolerated    Mobility  Bed Mobility               General bed mobility comments: Not tested; due to level of lethargy. Dependent to repositions upward in bed with use of trendelenburg bed position  Transfers                    Ambulation/Gait                 Stairs            Wheelchair Mobility    Modified Rankin (Stroke Patients Only)       Balance                                            Cognition Arousal/Alertness: Lethargic Behavior During Therapy: WFL for tasks assessed/performed;Restless (at times restless with verbal muttering) Overall Cognitive Status: History of cognitive impairments - at baseline                                        Exercises General Exercises - Lower Extremity Ankle Circles/Pumps:  AROM;Both;20 reps;Supine Quad Sets: Other (comment) (attempted; unalble to follow instructions) Short Arc Quad: AAROM;Both;20 reps;Supine Heel Slides: AAROM;Both;20 reps;Supine Hip ABduction/ADduction: AAROM;Both;20 reps;Supine Straight Leg Raises: AAROM;Both;20 reps;Supine    General Comments        Pertinent Vitals/Pain Pain Assessment: Faces Pain Score: 2  Faces Pain Scale: Hurts even more (with some RLE movement) Pain Location: R hip Pain Descriptors / Indicators: Guarding;Sore Pain Intervention(s): Repositioned    Home Living                      Prior Function            PT Goals (current goals can now be found in the care plan section) Progress towards PT goals: Not progressing toward goals - comment    Frequency    BID      PT Plan Current plan remains appropriate    Co-evaluation              AM-PAC PT "6 Clicks" Daily Activity  Outcome Measure  Difficulty turning over in bed (including adjusting bedclothes, sheets and  blankets)?: Total Difficulty moving from lying on back to sitting on the side of the bed? : Total Difficulty sitting down on and standing up from a chair with arms (e.g., wheelchair, bedside commode, etc,.)?: Total Help needed moving to and from a bed to chair (including a wheelchair)?: Total Help needed walking in hospital room?: Total Help needed climbing 3-5 steps with a railing? : Total 6 Click Score: 6    End of Session   Activity Tolerance: Patient limited by lethargy Patient left: in bed;with call bell/phone within reach;with bed alarm set;with SCD's reapplied   PT Visit Diagnosis: Muscle weakness (generalized) (M62.81);Difficulty in walking, not elsewhere classified (R26.2);Pain Pain - Right/Left: Right Pain - part of body: Hip     Time: 1610-9604 PT Time Calculation (min) (ACUTE ONLY): 35 min  Charges:  $Therapeutic Exercise: 23-37 mins                    G Codes:        Scot Dock,  PTA 05/27/2017, 12:18 PM

## 2017-05-27 NOTE — Evaluation (Signed)
Objective Swallowing Evaluation: Type of Study: MBS-Modified Barium Swallow Study  Patient Details  Name: Spencer Little MRN: [id number] Date of Birth: 08/25/1924  Today's Date: 05/27/2017 Time: SLP Start Time (ACUTE ONLY): 0935-SLP Stop Time (ACUTE ONLY): 1100 SLP Time Calculation (min) (ACUTE ONLY): 85 min  Past Medical History:  Past Medical History:  Diagnosis Date  . Atrial fibrillation (HCC)   . 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 Diabetes (HCC)   . Hypertension    Past Surgical History:  Past Surgical History:  Procedure Laterality Date  . CARDIAC CATHETERIZATION    . CHOLECYSTECTOMY    . FEMUR IM NAIL N/A 05/24/2017   Procedure: INTRAMEDULLARY (IM) NAIL FEMORAL;  Surgeon: Spencer Little, Michael, Spencer Little;  Location: [facility] ORS;  Service: Orthopedics;  Laterality: N/A;   HPI: Pt is a 83 y.o. male with a known history of Atrial fibrillation on Coumadin, moderate to severe dementia due to Alzheimer's and vascular dementia, non-insulin-dependent diabetes mellitus presents to the hospital secondary to a fall and right hip fracture. Pt has a h/o throat clearing and coughing/spitting residue in his throat for several years d/t "goiter" per Dtr. Pt was evaluated by bedside swallow on 05/22/17 and recommended a Dys II diet w/thin liquids initially, some confusion noted. Pt had surgical repair of hip fracture and was downgraded to a Dysphagia I diet with Nectar thick liquids d/t increased confusion, weakness and concern for aspiration.   Subjective: pt sitting in MBSS chair; Daughter not present. Pt appeared easily fatigued, head back - kyphotic. R hip fx s/p fall now post surgery. Pt has a baseline of throat clearing and coughing/spitting phlegm per Daughter for ~5-7 years - d/t goiter.   Assessment / Plan / Recommendation  CHL IP CLINICAL IMPRESSIONS 05/27/2017  Clinical Impression Pt appears to present w/ oropharyngeal phase dysphagia w/ Esophageal phase dysmotility and  possible impact at the level of upper esophageal sphincter w/ all bolus consistencies/trials. This degree of oropharyngeal phase dysphagia along w/ pt's baseline Dementia/Cognitive decline increases his risk for aspiration both during AND after the swallowing. During the Oral phase, pt exhibited increased oral phase time w/ trials of increased texture c/b moderate amount of time needed for pt to "chew" the puree trials; decreased lingual/oral control of liquid trials c/b premature spillage of such into the pharynx; min diffuse oral reside noted on tongue. During the Pharyngeal phase, pt exhibited decreased pharyngeal pressure and decreased laryngeal excursion and anterior movement during the swallowing resulting in moderate pharyngeal residue post trials - even w/ liquid consistency trials. This was noted to be increased in severity w/ puree trials(increased texture) w/ bolus residue in the valleculae and pyriform sinuses and along the posterior pharyngeal wall. With the increased pharyngeal residue remaining, slight-min amount of laryngeal Penetration then Aspiration followed b/t trials. Pt attempted to reduce this residue w/ f/u, dry swallowing but then followed w/ throat clearing and cough/spit - this is the same behavior pt has been exhibiting at home for ~5-7 years per Daughter's report(possibly related to a goiter?). Noted a reduced amount of bolus material cleared the UEs into the Esophagus w/ each swallow.   SLP Visit Diagnosis Dysphagia, oropharyngeal phase (R13.12);Dysphagia, pharyngoesophageal phase (R13.14)  Attention and concentration deficit following --  Frontal lobe and executive function deficit following --  Impact on safety and function Moderate aspiration risk      CHL IP TREATMENT RECOMMENDATION 05/27/2017  Treatment Recommendations Therapy as outlined in treatment plan below  Prognosis 05/27/2017  Prognosis for Safe Diet Advancement Guarded  Barriers to Reach Goals Cognitive  deficits;Severity of deficits  Barriers/Prognosis Comment --    CHL IP DIET RECOMMENDATION 05/27/2017  SLP Diet Recommendations Dysphagia 1 (Puree) solids;Nectar thick liquid  Liquid Administration via Cup  Medication Administration Crushed with puree  Compensations Minimize environmental distractions;Slow rate;Small sips/bites;Lingual sweep for clearance of pocketing;Multiple dry swallows after each bite/sip;Follow solids with liquid;Clear throat intermittently;Hard cough after swallow;Effortful swallow  Postural Changes Remain semi-upright after after feeds/meals (Comment);Seated upright at 90 degrees      CHL IP OTHER RECOMMENDATIONS 05/27/2017  Recommended Consults (No Data)  Oral Care Recommendations Oral care BID;Staff/trained caregiver to provide oral care  Other Recommendations Order thickener from pharmacy;Prohibited food (jello, ice cream, thin soups);Remove water pitcher;Have oral suction available      CHL IP FOLLOW UP RECOMMENDATIONS 05/27/2017  Follow up Recommendations Skilled Nursing facility      [facility] IP FREQUENCY AND DURATION 05/27/2017  Speech Therapy Frequency (ACUTE ONLY) min 3x week  Treatment Duration 2 weeks           CHL IP ORAL PHASE 05/27/2017  Oral Phase Impaired  Oral - Pudding Teaspoon --  Oral - Pudding Cup --  Oral - Honey Teaspoon --  Oral - Honey Cup --  Oral - Nectar Teaspoon --  Oral - Nectar Cup --  Oral - Nectar Straw --  Oral - Thin Teaspoon --  Oral - Thin Cup --  Oral - Thin Straw --  Oral - Puree --  Oral - Mech Soft --  Oral - Regular --  Oral - Multi-Consistency --  Oral - Pill --  Oral Phase - Comment pt exhibited increased oral phase time w/ trials of increased texture c/b moderate amount of time needed for pt to "chew" the puree trials; decreased lingual/oral control of liquid trials c/b premature spillage of such into the pharynx; min diffuse oral reside noted on tongue    CHL IP PHARYNGEAL PHASE 05/27/2017  Pharyngeal Phase  Impaired  Pharyngeal- Pudding Teaspoon --  Pharyngeal --  Pharyngeal- Pudding Cup --  Pharyngeal --  Pharyngeal- Honey Teaspoon --  Pharyngeal --  Pharyngeal- Honey Cup --  Pharyngeal --  Pharyngeal- Nectar Teaspoon --  Pharyngeal --  Pharyngeal- Nectar Cup --  Pharyngeal --  Pharyngeal- Nectar Straw --  Pharyngeal --  Pharyngeal- Thin Teaspoon --  Pharyngeal --  Pharyngeal- Thin Cup --  Pharyngeal --  Pharyngeal- Thin Straw --  Pharyngeal --  Pharyngeal- Puree --  Pharyngeal --  Pharyngeal- Mechanical Soft --  Pharyngeal --  Pharyngeal- Regular --  Pharyngeal --  Pharyngeal- Multi-consistency --  Pharyngeal --  Pharyngeal- Pill --  Pharyngeal --  Pharyngeal Comment pt exhibited decreased pharyngeal pressure and decreased laryngeal excursion during the swallowing resulting in moderate pharyngeal residue post trials - even w/ liquid consistency trials. This was noted to be increased in severity w/ puree trials(increased texture) w/ bolus residue in the valleculae and pyriform sinuses and along the posterior pharyngeal wall. Pt attempted to reduce this residue w/ f/u, dry swallowing but then followed w/ throat clearing and cough/spit - this is the same behavior pt has been exhibiting at home for ~5-7 years per Daughter's report(possibly related to a goiter?). Noted a reduced amount of bolus material cleared the UEs into the Esophagus w/ each swallow.      CHL IP CERVICAL ESOPHAGEAL PHASE 05/27/2017  Cervical Esophageal Phase Impaired  Pudding Teaspoon --  Pudding Cup --  Honey  Teaspoon --  Honey Cup --  Nectar Teaspoon --  Nectar Cup --  Nectar Straw --  Thin Teaspoon --  Thin Cup --  Thin Straw --  Puree --  Mechanical Soft --  Regular --  Multi-consistency --  Pill --  Cervical Esophageal Comment pt appeared to clear a reduced amount of bolus material through the UES w/ each swallow; min bolus stasis noted in the lower cervical Esophagus intermittently        Spencer Som, MS, CCC-SLP Spencer Little 05/27/2017, 12:10 PM

## 2017-05-27 NOTE — Progress Notes (Signed)
Physical Therapy Treatment Patient Details Name: Spencer Little MRN: [id number] DOB: 07/01/1924 Today's Date: 05/27/2017    History of Present Illness Pt is a 83 y.o. male s/p fall weeding his garden sustaining a comminuted R peritrochanteric/subtrochanteric femur fx 05/21/17 (surgery delayed d/t elevated INR).  Pt s/p IMN 05/24/17.  PMH includes dementia, a-fib on Coumadin, DM, htn.    PT Comments    Pt lethargic and does not respond to questioning. Pt assisted with lower extremity exercises for 10 repetitions each. Pt does responds to assisted movement more so than instruction. Continue PT to progress participation, strength and endurance to improve up in bed/out of bed function.    Follow Up Recommendations  SNF     Equipment Recommendations  Rolling walker with 5" wheels    Recommendations for Other Services       Precautions / Restrictions Precautions Precautions: Fall Restrictions Weight Bearing Restrictions: Yes RLE Weight Bearing: Weight bearing as tolerated    Mobility  Bed Mobility               General bed mobility comments: Not tested; due to level of lethargy. Dependent to repositions upward in bed with use of trendelenburg bed position  Transfers                    Ambulation/Gait                 Stairs            Wheelchair Mobility    Modified Rankin (Stroke Patients Only)       Balance                                            Cognition Arousal/Alertness: Lethargic Behavior During Therapy: WFL for tasks assessed/performed;Restless Overall Cognitive Status: History of cognitive impairments - at baseline                                        Exercises General Exercises - Lower Extremity Ankle Circles/Pumps: AROM;Both;20 reps;Supine Quad Sets: Other (comment) (attempted; unalble to follow instructions) Short Arc Quad: AAROM;Both;Supine;10 reps Heel Slides: AAROM;Both;Supine;10  reps Hip ABduction/ADduction: AAROM;Both;Supine;10 reps Straight Leg Raises: AAROM;Both;Supine;10 reps    General Comments        Pertinent Vitals/Pain Pain Assessment: Faces Pain Score: 2  Faces Pain Scale: Hurts even more (with some RLE movement) Pain Location: R hip Pain Descriptors / Indicators: Guarding;Sore Pain Intervention(s): Repositioned    Home Living                      Prior Function            PT Goals (current goals can now be found in the care plan section) Progress towards PT goals: Not progressing toward goals - comment    Frequency    BID      PT Plan Current plan remains appropriate    Co-evaluation              AM-PAC PT "6 Clicks" Daily Activity  Outcome Measure  Difficulty turning over in bed (including adjusting bedclothes, sheets and blankets)?: Total Difficulty moving from lying on back to sitting on the side of the bed? : Total Difficulty sitting down on and standing  up from a chair with arms (e.g., wheelchair, bedside commode, etc,.)?: Total Help needed moving to and from a bed to chair (including a wheelchair)?: Total Help needed walking in hospital room?: Total Help needed climbing 3-5 steps with a railing? : Total 6 Click Score: 6    End of Session   Activity Tolerance: Patient limited by lethargy Patient left: in bed;with call bell/phone within reach;with bed alarm set;with SCD's reapplied;with family/visitor present   PT Visit Diagnosis: Muscle weakness (generalized) (M62.81);Difficulty in walking, not elsewhere classified (R26.2);Pain Pain - Right/Left: Right Pain - part of body: Hip     Time: [id number] PT Time Calculation (min) (ACUTE ONLY): 17 min  Charges:  $Therapeutic Exercise: 8-22 mins                    G Codes:        Scot DockHeidi E Yannick Steuber, PTA 05/27/2017, 2:51 PM

## 2017-05-27 NOTE — Progress Notes (Signed)
Subjective: 3 Days Post-Op Procedure(s) (LRB): INTRAMEDULLARY (IM) NAIL FEMORAL (N/A) Patient reports pain as mild this AM.  Dementia appears worse this AM. Patient is well but has a history of dementia.  Since surgery patient has been choking with any attempted oral intake, kept NPO, waiting on barium swallow study. Plan is to go Skilled nursing facility after hospital stay. Negative for chest pain and shortness of breath Fever: no Gastrointestinal:Negative for nausea and vomiting Pt removed IV and pulled off surgical dressings, placed in safety mitts.  Objective: Vital signs in last 24 hours: Temp:  [98.6 F (37 C)] 98.6 F (37 C) (06/11 0009) Pulse Rate:  [83-90] 90 (06/11 0009) Resp:  [18] 18 (06/11 0009) BP: (133-142)/(53-86) 142/86 (06/11 0009) SpO2:  [97 %-100 %] 97 % (06/11 0009)  Intake/Output from previous day:  Intake/Output Summary (Last 24 hours) at 05/27/17 0748 Last data filed at 05/26/17 1520  Gross per 24 hour  Intake           921.25 ml  Output                0 ml  Net           921.25 ml    Intake/Output this shift: No intake/output data recorded.  Labs:  Recent Labs  05/25/17 0310 05/26/17 0703 05/27/17 0334  HGB 8.5* 8.4* 8.1*    Recent Labs  05/26/17 0703 05/27/17 0334  WBC 9.8 9.5  RBC 2.56* 2.43*  HCT 24.6* 23.4*  PLT 199 213    Recent Labs  05/26/17 0703 05/27/17 0334  NA 146* 147*  K 3.1* 3.8  CL 114* 116*  CO2 25 26  BUN 43* 41*  CREATININE 0.82 0.64  GLUCOSE 213* 135*  CALCIUM 9.0 8.9    Recent Labs  05/26/17 0703 05/27/17 0334  INR 1.74 1.61     EXAM General - Patient is Disorganized, Confused and Lacking.  Patient is reaching up to the sky but is able to answer simple questions.  Safety mitts in place. Extremity - ABD soft Sensation intact distally Intact pulses distally Dorsiflexion/Plantar flexion intact Incision: dressing C/D/I No cellulitis present Dressing/Incision - Clear, dry, no drainage  present. Motor Function - intact, moving foot and toes well on exam.  Past Medical History:  Diagnosis Date  . Atrial fibrillation (HCC)   . 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 Diabetes (HCC)   . Hypertension    Assessment/Plan: 3 Days Post-Op Procedure(s) (LRB): INTRAMEDULLARY (IM) NAIL FEMORAL (N/A) Active Problems:   Hip fracture (HCC)   Pressure injury of skin  Estimated body mass index is 21.77 kg/m as calculated from the following:   Height as of this encounter: 6\' 1"  (1.854 m).   Weight as of this encounter: 74.8 kg (165 lb). Up with therapy  Continue NPO and IVF until barium swallow performed.   Labs reviewed, Hg 8.1.  Continue to monitor Hypokalemia resolved, Na 147. Continue with safety mitts at this time. Up with therapy today, will likely need SNF placement upon discharge. Daily INR checks. INR 1.61 at this time, continue on warfarin and Lovenox until therapeutic.  DVT Prophylaxis - Lovenox, Coumadin, Foot Pumps and TED hose Weight-Bearing as tolerated to right leg  J. Horris LatinoLance McGhee, PA-C [facility] 05/27/2017, 7:48 AM

## 2017-05-28 DIAGNOSIS — Z515 Encounter for palliative care: Secondary | ICD-10-CM

## 2017-05-28 DIAGNOSIS — R627 Adult failure to thrive: Secondary | ICD-10-CM

## 2017-05-28 DIAGNOSIS — R1310 Dysphagia, unspecified: Secondary | ICD-10-CM

## 2017-05-28 DIAGNOSIS — S72001A Fracture of unspecified part of neck of right femur, initial encounter for closed fracture: Secondary | ICD-10-CM

## 2017-05-28 DIAGNOSIS — Z66 Do not resuscitate: Secondary | ICD-10-CM

## 2017-05-28 LAB — BASIC METABOLIC PANEL
ANION GAP: 9 (ref 5–15)
BUN: 27 mg/dL — ABNORMAL HIGH (ref 6–20)
CALCIUM: 8.6 mg/dL — AB (ref 8.9–10.3)
CO2: 25 mmol/L (ref 22–32)
Chloride: 107 mmol/L (ref 101–111)
Creatinine, Ser: 0.52 mg/dL — ABNORMAL LOW (ref 0.61–1.24)
GLUCOSE: 143 mg/dL — AB (ref 65–99)
Potassium: 3.2 mmol/L — ABNORMAL LOW (ref 3.5–5.1)
Sodium: 141 mmol/L (ref 135–145)

## 2017-05-28 LAB — BPAM FFP
BLOOD PRODUCT EXPIRATION DATE: 201806122359
Blood Product Expiration Date: 201806122359
Blood Product Expiration Date: 201806132359
Blood Product Expiration Date: 201806132359
ISSUE DATE / TIME: 201806080739
ISSUE DATE / TIME: 201806080815
ISSUE DATE / TIME: 201806081104
UNIT TYPE AND RH: 5100
Unit Type and Rh: 5100
Unit Type and Rh: 5100
Unit Type and Rh: 5100

## 2017-05-28 LAB — PREPARE FRESH FROZEN PLASMA
UNIT DIVISION: 0
Unit division: 0
Unit division: 0
Unit division: 0

## 2017-05-28 LAB — GLUCOSE, CAPILLARY
GLUCOSE-CAPILLARY: 151 mg/dL — AB (ref 65–99)
GLUCOSE-CAPILLARY: 111 mg/dL — AB (ref 65–99)
Glucose-Capillary: 141 mg/dL — ABNORMAL HIGH (ref 65–99)

## 2017-05-28 MED ORDER — POTASSIUM CHLORIDE 10 MEQ/100ML IV SOLN
10.0000 meq | INTRAVENOUS | Status: AC
  Administered 2017-05-28 (×4): 10 meq via INTRAVENOUS
  Filled 2017-05-28 (×4): qty 100

## 2017-05-28 MED ORDER — HYDROCODONE-ACETAMINOPHEN 5-325 MG PO TABS: 1.0000 | ORAL_TABLET | ORAL | 0 refills | Status: AC | PRN

## 2017-05-28 MED ORDER — ACETAMINOPHEN 160 MG/5ML PO SOLN
650.0000 mg | Freq: Three times a day (TID) | ORAL | Status: DC
  Filled 2017-05-28 (×3): qty 20.3

## 2017-05-28 NOTE — Care Management Important Message (Signed)
Important Message  Patient Details  Name: Spencer Little MRN: [id number] Date of Birth: 25-May-1924   Medicare Important Message Given:  Yes    Marily MemosLisa M Leya Paige, RN 05/28/2017, 11:51 AM

## 2017-05-28 NOTE — Discharge Summary (Signed)
[facility]   PATIENT NAME: Spencer Little    MR#:  [id number]  DATE OF BIRTH:  31-May-1924  DATE OF ADMISSION:  05/21/2017 ADMITTING PHYSICIAN: Enid Baas, MD  DATE OF DISCHARGE: 05/28/17  PRIMARY CARE PHYSICIAN: Veneda Melter, FNP    ADMISSION DIAGNOSIS:  Closed hip fracture, right, initial encounter (HCC) [S72.001A]  DISCHARGE DIAGNOSIS:  Right hip fracture s/p surgery Chronic afib Vascular severe dementia Dsyphagia-now on pureed diet  SECONDARY DIAGNOSIS:   Past Medical History:  Diagnosis Date  . Atrial fibrillation (HCC)   . 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 Diabetes (HCC)   . Hypertension     HOSPITAL COURSE:   Spencer Stanfieldis a 83 y.o. malewith a known history of Atrial fibrillation on Coumadin, moderate to severe dementia due to Alzheimer's and vascular dementia, non-insulin-dependent diabetes mellitus presents to the hospital secondary to a fall and right hip fracture.  #1 Right hip fracture- due to mechanical fall - orthopedics consulted,Status post surgery and postop day 4 today. - Pain management. Physical therapy consulted- needs SNF. -incentive spirometry, Urine analysis was normal on admission.  #2 hypertension-continue lisinopril, Norvasc. Hold Lasix.   #3 Dysphagia with high risk for aspiration Follow ST recommendations -CT of the head negative for  stroke  -Also thyroid ultrasound with multinodular goitre but no mechanical compression.  -Discussed with speech therapist. Patient has an abnormal modified barium swallow. High risk for aspiration. -d/w dter and understands complications  #4 dementia-follows with neurology as outpatient. Continue Aricept and Namenda - overall declined in the last few days -seen by Palliative care---plan is to send pt to rehab and palliative to follow. If continues to worsen than consider hospice/comfort care --dter  agreeable with the plan  #5 atrial fibrillation-chronic, stable. Heart rate on the lower side, so not on any rate limiting medications. ECHO done -Coumadin to be restarted now.  #6 Anemia- acute on chronic post op anemia- hemoglobin levels dropped from 11-8.5 -Continue to monitor closely especially patient on Coumadin at this time. - No acute indication for transfusion at this time, not symptomatic. - Transfuse if hemoglobin is less than 7 or patient becomes symptomatic  #7 DVT prophylaxis-Coumadin restarted, INR at 1.7 today.    palliative care consult. Patient appears to be declining overall. Has overall long-term poor prognosis. He is at very high risk for aspiration and recurrent dehydration. D/c plan d/w dter in the room  D/c to [facility] CONSULTS OBTAINED:  Treatment Team:  Donato Heinz, MD  DRUG ALLERGIES:   Allergies  Allergen Reactions  . Penicillins     DISCHARGE MEDICATIONS:   Current Discharge Medication List    START taking these medications   Details  HYDROcodone-acetaminophen (NORCO/VICODIN) 5-325 MG tablet Take 1-2 tablets by mouth every 4 (four) hours as needed for moderate pain. Qty: 30 tablet, Refills: 0      CONTINUE these medications which have NOT CHANGED   Details  amLODipine (NORVASC) 10 MG tablet Take 10 mg by mouth daily.    donepezil (ARICEPT) 10 MG tablet Take 10 mg by mouth every evening.     finasteride (PROSCAR) 5 MG tablet Take 5 mg by mouth every evening.     fosinopril (MONOPRIL) 20 MG tablet Take 20 mg by mouth daily.    furosemide (LASIX) 40 MG tablet Take 40 mg by mouth daily.    memantine (NAMENDA) 10 MG tablet Take 10 mg by mouth  2 (two) times daily.     simvastatin (ZOCOR) 10 MG tablet Take 10 mg by mouth every evening.    warfarin (COUMADIN) 4 MG tablet Take 4 mg by mouth daily at 6 PM.       STOP taking these medications     glipiZIDE (GLUCOTROL) 5 MG tablet         If you experience worsening of  your admission symptoms, develop shortness of breath, life threatening emergency, suicidal or homicidal thoughts you must seek medical attention immediately by calling 911 or calling your MD immediately  if symptoms less severe.  You Must read complete instructions/literature along with all the possible adverse reactions/side effects for all the Medicines you take and that have been prescribed to you. Take any new Medicines after you have completely understood and accept all the possible adverse reactions/side effects.   Please note  You were cared for by a hospitalist during your hospital stay. If you have any questions about your discharge medications or the care you received while you were in the hospital after you are discharged, you can call the unit and asked to speak with the hospitalist on call if the hospitalist that took care of you is not available. Once you are discharged, your primary care physician will handle any further medical issues. Please note that NO REFILLS for any discharge medications will be authorized once you are discharged, as it is imperative that you return to your primary care physician (or establish a relationship with a primary care physician if you do not have one) for your aftercare needs so that they can reassess your need for medications and monitor your lab values. Today   SUBJECTIVE  Confused at baseline  VITAL SIGNS:  Blood pressure (!) 135/91, pulse 99, temperature 97.8 F (36.6 C), temperature source Oral, resp. rate 16, height 6\' 1"  (1.854 m), weight 74.8 kg (165 lb), SpO2 97 %.  I/O:   Intake/Output Summary (Last 24 hours) at 05/28/17 1145 Last data filed at 05/27/17 1800  Gross per 24 hour  Intake                0 ml  Output                0 ml  Net                0 ml    PHYSICAL EXAMINATION:  GENERAL:  83 y.o.-year-old patient lying in the bed with no acute distress. thin EYES: Pupils equal, round, reactive to light and accommodation. No  scleral icterus. Extraocular muscles intact.  HEENT: Head atraumatic, normocephalic. Oropharynx and nasopharynx clear.  NECK:  Supple, no jugular venous distention. No thyroid enlargement, no tenderness.  LUNGS: Normal breath sounds bilaterally, no wheezing, rales,rhonchi or crepitation. No use of accessory muscles of respiration.  CARDIOVASCULAR: S1, S2 normal. No murmurs, rubs, or gallops.  ABDOMEN: Soft, non-tender, non-distended. Bowel sounds present. No organomegaly or mass.  EXTREMITIES: No pedal edema, cyanosis, or clubbing.  NEUROLOGIC: moves all extremities well PSYCHIATRIC: The patient is alert but confused SKIN: No obvious rash, lesion, or ulcer.   DATA REVIEW:   CBC   Recent Labs Lab 05/27/17 0334  WBC 9.5  HGB 8.1*  HCT 23.4*  PLT 213    Chemistries   Recent Labs Lab 05/21/17 1348 05/22/17 0444  05/28/17 1054  NA 139 136  < > 141  K 4.8 3.3*  < > 3.2*  CL 103 98*  < >  107  CO2 29 29  < > 25  GLUCOSE 170* 148*  < > 143*  BUN 20 25*  < > 27*  CREATININE 0.88 0.94  < > 0.52*  CALCIUM 9.2 8.9  < > 8.6*  MG  --  2.0  --   --   AST 31  --   --   --   ALT 17  --   --   --   ALKPHOS 66  --   --   --   BILITOT 1.0  --   --   --   < > = values in this interval not displayed.  Microbiology Results   Recent Results (from the past 240 hour(s))  Surgical PCR screen     Status: Abnormal   Collection Time: 05/21/17  6:10 PM  Result Value Ref Range Status   MRSA, PCR NEGATIVE NEGATIVE Final   Staphylococcus aureus POSITIVE (A) NEGATIVE Final    Comment:        The Xpert SA Assay (FDA approved for NASAL specimens in patients over 11 years of age), is one component of a comprehensive surveillance program.  Test performance has been validated by [facility] for patients greater than or equal to 5 year old. It is not intended to diagnose infection nor to guide or monitor treatment.     RADIOLOGY:  No results found.   Management plans discussed with  the patient, family and they are in agreement.  CODE STATUS:     Code Status Orders        Start     Ordered   05/28/17 0919  Do not attempt resuscitation (DNR)  Continuous    Question Answer Comment  In the event of cardiac or respiratory ARREST Do not call a "code blue"   In the event of cardiac or respiratory ARREST Do not perform Intubation, CPR, defibrillation or ACLS   In the event of cardiac or respiratory ARREST Use medication by any route, position, wound care, and other measures to relive pain and suffering. May use oxygen, suction and manual treatment of airway obstruction as needed for comfort.      05/28/17 0921    Code Status History    Date Active Date Inactive Code Status Order ID Comments User Context   05/21/2017  5:36 PM 05/28/2017  9:21 AM Full Code [id number]  Enid Baas, MD Inpatient    Advance Directive Documentation     Most Recent Value  Type of Advance Directive  Healthcare Power of Attorney, Living will  Pre-existing out of facility DNR order (yellow form or pink MOST form)  -  "MOST" Form in Place?  -      TOTAL TIME TAKING CARE OF THIS PATIENT: *40* minutes.    Srikar Chiang M.D on 05/28/2017 at 11:45 AM  Between 7am to 6pm - Pager - [phone] After 6pm go to [email or web address] - Social research officer, government  [facility]  Office  [phone]  CC: Primary care physician; Veneda Melter, FNP

## 2017-05-28 NOTE — Clinical Social Work Placement (Signed)
   CLINICAL SOCIAL WORK PLACEMENT  NOTE  Date:  05/28/2017  Patient Details  Name: Spencer Little MRN: [id number] Date of Birth: 05-24-1924  Clinical Social Work is seeking post-discharge placement for this patient at the Skilled  Nursing Facility level of care (*CSW will initial, date and re-position this form in  chart as items are completed):  Yes   Patient/family provided with [facility] Clinical Social Work Department's list of facilities offering this level of care within the geographic area requested by the patient (or if unable, by the patient's family).  Yes   Patient/family informed of their freedom to choose among providers that offer the needed level of care, that participate in Medicare, Medicaid or managed care program needed by the patient, have an available bed and are willing to accept the patient.  Yes   Patient/family informed of [facility]'s ownership interest in [facility] and [facility], as well as of the fact that they are under no obligation to receive care at these facilities.  PASRR submitted to EDS on 05/22/17     PASRR number received on 05/22/17     Existing PASRR number confirmed on       FL2 transmitted to all facilities in geographic area requested by pt/family on 05/22/17     FL2 transmitted to all facilities within larger geographic area on       Patient informed that his/her managed care company has contracts with or will negotiate with certain facilities, including the following:        Yes   Patient/family informed of bed offers received.  Patient chooses bed at  [facility] )     Physician recommends and patient chooses bed at      Patient to be transferred to  [facility] ) on 05/28/17.  Patient to be transferred to facility by  [facility] )     Patient family notified on 05/28/17 of transfer.  Name of family member notified:   (Patient's daughter Gavin PoundDeborah is at bedside and aware of D/C today. )      PHYSICIAN       Additional Comment:    _______________________________________________ Guiseppe Flanagan, Darleen CrockerBailey M, LCSW 05/28/2017, 3:55 PM

## 2017-05-28 NOTE — Progress Notes (Signed)
New referral for Palliative to follow at [facility] received from CSW [employer]iley Sample. Plan is for discharge today. Referral made aware. Thank you. Dayna BarkerKaren robertson RN, BSN, [facility], [facility] Liaison [phone] c

## 2017-05-28 NOTE — Progress Notes (Signed)
Subjective: 4 Days Post-Op Procedure(s) (LRB): INTRAMEDULLARY (IM) NAIL FEMORAL (N/A) Patient reports pain as mild this AM. Denies any other complaints Daughter at bedside, states patient has complained of pain. Dementia at baseline Plan is to go Skilled nursing facility after hospital stay.   Objective: Vital signs in last 24 hours: Temp:  [97.8 F (36.6 C)-98.9 F (37.2 C)] 97.8 F (36.6 C) (06/12 0743) Pulse Rate:  [82-99] 99 (06/12 0743) Resp:  [16-20] 16 (06/12 0743) BP: (135-146)/(83-91) 135/91 (06/12 0743) SpO2:  [97 %-100 %] 97 % (06/12 0743)  Intake/Output from previous day:  Intake/Output Summary (Last 24 hours) at 05/28/17 0950 Last data filed at 05/27/17 1800  Gross per 24 hour  Intake                0 ml  Output                0 ml  Net                0 ml    Intake/Output this shift: No intake/output data recorded.  Labs:  Recent Labs  05/26/17 0703 05/27/17 0334  HGB 8.4* 8.1*    Recent Labs  05/26/17 0703 05/27/17 0334  WBC 9.8 9.5  RBC 2.56* 2.43*  HCT 24.6* 23.4*  PLT 199 213    Recent Labs  05/26/17 0703 05/27/17 0334  NA 146* 147*  K 3.1* 3.8  CL 114* 116*  CO2 25 26  BUN 43* 41*  CREATININE 0.82 0.64  GLUCOSE 213* 135*  CALCIUM 9.0 8.9    Recent Labs  05/26/17 0703 05/27/17 0334  INR 1.74 1.61     EXAM General - Patient is Disorganized, Confused and Lacking.  Patient is able to answer simple questions.  Safety mitts in place. Extremity - ABD soft Sensation intact distally Intact pulses distally Dorsiflexion/Plantar flexion intact Incision: dressing C/D/I No cellulitis present  New dressing applied Dressing/Incision - Clear, dry, no drainage present. Motor Function - intact, moving foot and toes well on exam.  Past Medical History:  Diagnosis Date  . Atrial fibrillation (HCC)   . 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 Diabetes (HCC)   . Hypertension    Assessment/Plan: 4 Days  Post-Op Procedure(s) (LRB): INTRAMEDULLARY (IM) NAIL FEMORAL (N/A) Active Problems:   Hip fracture (HCC)   Pressure injury of skin  Estimated body mass index is 21.77 kg/m as calculated from the following:   Height as of this encounter: 6\' 1"  (1.854 m).   Weight as of this encounter: 74.8 kg (165 lb). Continue with PT Daily INR checks. INR pending this am, continue on warfarin and Lovenox until therapeutic. Dressing changed this am Remove staples and apply steri strips on 06/07/17 Follow up with [facility] in 2 weeks for staple removal and steri strip application  DVT Prophylaxis - Lovenox, Coumadin, Foot Pumps and TED hose Weight-Bearing as tolerated to right leg  Evon Slackhomas C. Gaines, PA-C [facility] 05/28/2017, 9:50 AM

## 2017-05-28 NOTE — Progress Notes (Signed)
Patient is alert to self only. Daughter at bedside. Discharge instructions and information gone over with daughter. VS stable. PIV removed. Belongings packed up at this time. EMS called for transport. No concerns voiced from daughter or patient at this time. [facility].

## 2017-05-28 NOTE — Progress Notes (Signed)
Called report to nurse Rosey Batheresa at [facility].

## 2017-05-28 NOTE — Consult Note (Signed)
Consultation Note Date: 05/28/2017   Patient Name: Spencer Little  DOB: [birth year]  MRN: [id number]  Age / Sex: 83 y.o., male  PCP: Veneda Melter, FNP Referring Physician: Enedina Finner, MD  Reason for Consultation: Establishing goals of care and Psychosocial/spiritual support  HPI/Patient Profile: 83 y.o. male  with past medical history of  admitted on 05/21/2017 with a past medical history of atrial fibrillation on coumadin, moderate to severe dementia due to Alzheimer's and vascular dementia, non-insulin-dependent diabetes mellitus presents to the hospital secondary to a fall and right hip fracture.  S/p Sugical repair of hip fracture and plan is for discharge to SNF for rehab  Family face advanced directive decisions and anticipatory care needs.   Clinical Assessment and Goals of Care:  This NP Lorinda Creed reviewed medical records, received report from team, assessed the patient and then meet at the patient's bedside along with his daughter and son  to discuss diagnosis, prognosis, GOC, EOL wishes disposition and options.  A detailed discussion was had today regarding advanced directives.  Concepts specific to code status, artifical feeding and hydration, continued IV antibiotics and rehospitalization was had.  The difference between a aggressive medical intervention path  and a palliative comfort care path for this patient at this time was had.  Values and goals of care important to patient and family were attempted to be elicited.  Patient remains pleasantly confused with poor po intake, generalized weakness and HIGH risk for decompensation.  I shared my concerns with his son and daughter today during GOC meeting.  He is high risk for decompensation, family understand and are ready for alternative course of care if patient fails rehab trial.  MOST form introduced    Hard Choices booklet left for  review  Concept of Hospice and Palliative Care were discussed  Natural trajectory and expectations at EOL were discussed.  Questions and concerns addressed.   Family encouraged to call with questions or concerns.     HCPOA/daughter Lowella Dandy    SUMMARY OF RECOMMENDATIONS    Code Status/Advance Care Planning:  DNR-documented today  Symptom:  Pain 2/2hip fracture: Tylenol 650 mg/soln po every 8 hrs scheduled  Palliative Prophylaxis:   Aspiration, Bowel Regimen, Frequent Pain Assessment and Oral Care  Additional Recommendations (Limitations, Scope, Preferences):  Avoid Hospitalization and No Artificial Feeding  Psycho-social/Spiritual:   Desire for further Chaplaincy support:no  Additional Recommendations: Education on Hospice  Prognosis:   < 3 months  Discharge Planning: Skilled Nursing Facility for rehab with Palliative care service follow-up      Primary Diagnoses: Present on Admission: . Hip fracture (HCC)   I have reviewed the medical record, interviewed the patient and family, and examined the patient. The following aspects are pertinent.  Past Medical History:  Diagnosis Date  . Atrial fibrillation (HCC)   . 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 Diabetes (HCC)   . Hypertension    Social History   Social History  . Marital status: Married  Spouse name: N/A  . Number of children: N/A  . Years of education: N/A   Social History Main Topics  . Smoking status: Former Games developermoker  . Smokeless tobacco: Never Used  . Alcohol use No  . Drug use: No  . Sexual activity: Not Asked   Other Topics Concern  . None   Social History Narrative   Lives at home with his wife, has unsteady gait but no cane or walker until now.   Family History  Problem Relation Age of Onset  . CAD Mother   . Diabetes Mother   . Hypertension Mother    Scheduled Meds: . amLODipine  10 mg Oral Daily  . docusate sodium  100 mg Oral BID  .  donepezil  10 mg Oral QPM  . finasteride  5 mg Oral QPM  . insulin aspart  0-5 Units Subcutaneous QHS  . insulin aspart  0-9 Units Subcutaneous TID WC  . lisinopril  20 mg Oral Daily  . memantine  10 mg Oral BID  . mupirocin ointment   Nasal BID  . simvastatin  10 mg Oral QPM  . Warfarin - Physician Dosing Inpatient   Does not apply q1800   Continuous Infusions: PRN Meds:.acetaminophen **OR** acetaminophen, alum & mag hydroxide-simeth, bisacodyl, fentaNYL (SUBLIMAZE) injection, HYDROcodone-acetaminophen, magnesium citrate, magnesium hydroxide, menthol-cetylpyridinium **OR** phenol, metoCLOPramide **OR** metoCLOPramide (REGLAN) injection, ondansetron **OR** ondansetron (ZOFRAN) IV Medications Prior to Admission:  Prior to Admission medications   Medication Sig Start Date End Date Taking? Authorizing Provider  amLODipine (NORVASC) 10 MG tablet Take 10 mg by mouth daily.   Yes [provider]  donepezil (ARICEPT) 10 MG tablet Take 10 mg by mouth every evening.  01/04/17  Yes [provider]  finasteride (PROSCAR) 5 MG tablet Take 5 mg by mouth every evening.    Yes [provider]  fosinopril (MONOPRIL) 20 MG tablet Take 20 mg by mouth daily.   Yes [provider]  furosemide (LASIX) 40 MG tablet Take 40 mg by mouth daily.   Yes [provider]  glipiZIDE (GLUCOTROL) 5 MG tablet Take 5 mg by mouth every evening.    Yes [provider]  memantine (NAMENDA) 10 MG tablet Take 10 mg by mouth 2 (two) times daily.  01/08/17  Yes [provider]  simvastatin (ZOCOR) 10 MG tablet Take 10 mg by mouth every evening.   Yes [provider]  warfarin (COUMADIN) 4 MG tablet Take 4 mg by mouth daily at 6 PM.    Yes [provider]   Allergies  Allergen Reactions  . Penicillins    Review of Systems  Unable to perform ROS: Mental status change    Physical Exam  Constitutional: He appears cachectic. He appears ill.    -pleasantly confused  Cardiovascular: Tachycardia present.   Pulmonary/Chest: He has decreased breath sounds in the right lower field and the left lower field.  Skin: Skin is warm and dry.    Vital Signs: BP (!) 135/91 (BP Location: Right Arm)   Pulse 99   Temp 97.8 F (36.6 C) (Oral)   Resp 16   Ht 6\' 1"  (1.854 m)   Wt 74.8 kg (165 lb)   SpO2 97%   BMI 21.77 kg/m  Pain Assessment: 0-10 POSS *See Group Information*: 1-Acceptable,Awake and alert Pain Score: 0-No pain   SpO2: SpO2: 97 % O2 Device:SpO2: 97 % O2 Flow Rate: .O2 Flow Rate (L/min): 4 L/min  IO: Intake/output summary:  Intake/Output Summary (Last  24 hours) at 05/28/17 0837 Last data filed at 05/27/17 1800  Gross per 24 hour  Intake                0 ml  Output                0 ml  Net                0 ml    LBM: Last BM Date: 05/27/17 Baseline Weight: Weight: 74.8 kg (165 lb) Most recent weight: Weight: 74.8 kg (165 lb)      Palliative Assessment/Data: 30 % at best   Flowsheet Rows     Most Recent Value  Intake Tab  Referral Department  Hospitalist  Unit at Time of Referral  Orthopedic Unit  Palliative Care Primary Diagnosis  Neurology  Date Notified  05/27/17  Palliative Care Type  New Palliative care  Reason for referral  Clarify Goals of Care  Date of Admission  05/21/17  # of days IP prior to Palliative referral  6  Clinical Assessment  Psychosocial & Spiritual Assessment  Palliative Care Outcomes     Discussed with Dr Allena Katz   Time In: 0830 Time Out: 0945 Time Total: 75 min Greater than 50%  of this time was spent counseling and coordinating care related to the above assessment and plan.  Signed by: Lorinda Creed, NP   Please contact Palliative Medicine Team phone at [phone] for questions and concerns.  For individual provider: See Loretha Stapler

## 2017-05-28 NOTE — Progress Notes (Signed)
Physical Therapy Treatment Patient Details Name: Spencer Little C Drone MRN: [id number] DOB: November 20, 1924 Today's Date: 05/28/2017    History of Present Illness Pt is a 83 y.o. male s/p fall weeding his garden sustaining a comminuted R peritrochanteric/subtrochanteric femur fx 05/21/17 (surgery delayed d/t elevated INR).  Pt s/p IMN 05/24/17.  PMH includes dementia, a-fib on Coumadin, DM, htn.    PT Comments    Pt agreeable to PT; continues with limited verbalization, but is at times able to express wants. Pt does not answer questions understandably. Pt expresses pain with movement of Right lower extremity at times, but is noted to ease with continued repetitions. Daughter present at end of session; educated on elevating distal lower extremities to float heels and avoiding "clam shell" type position that pt has been found in to avoid excess pressure on sacrum/buttocks. At the time of this document, pt is to be transferred to skilled nursing facility.    Follow Up Recommendations  SNF     Equipment Recommendations  Rolling walker with 5" wheels    Recommendations for Other Services       Precautions / Restrictions Restrictions Weight Bearing Restrictions: Yes RLE Weight Bearing: Weight bearing as tolerated    Mobility  Bed Mobility               General bed mobility comments: Not tested, due to level of lethargy and cognition  Transfers                    Ambulation/Gait                 Stairs            Wheelchair Mobility    Modified Rankin (Stroke Patients Only)       Balance                                            Cognition Arousal/Alertness: Lethargic Behavior During Therapy: WFL for tasks assessed/performed;Restless Overall Cognitive Status: History of cognitive impairments - at baseline                                        Exercises General Exercises - Lower Extremity Ankle Circles/Pumps:  AAROM;Both;20 reps;Supine Short Arc Quad: AAROM;Both;15 reps;Supine Heel Slides: AAROM;Both;15 reps;Supine Hip ABduction/ADduction: AAROM;Both;15 reps;Supine Straight Leg Raises: AAROM;Both;15 reps;Supine Other Exercises Other Exercises: educated daughter on positioning of lower extremities and decreasing pressure on sacrum/buttocks    General Comments        Pertinent Vitals/Pain Pain Assessment: Faces Faces Pain Scale: Hurts even more Pain Location: R hip    Home Living                      Prior Function            PT Goals (current goals can now be found in the care plan section) Progress towards PT goals: Not progressing toward goals - comment    Frequency    BID      PT Plan Current plan remains appropriate    Co-evaluation              AM-PAC PT "6 Clicks" Daily Activity  Outcome Measure  Difficulty turning over in bed (including adjusting bedclothes, sheets and blankets)?:  Total Difficulty moving from lying on back to sitting on the side of the bed? : Total Difficulty sitting down on and standing up from a chair with arms (e.g., wheelchair, bedside commode, etc,.)?: Total Help needed moving to and from a bed to chair (including a wheelchair)?: Total Help needed walking in hospital room?: Total Help needed climbing 3-5 steps with a railing? : Total 6 Click Score: 6    End of Session   Activity Tolerance: Patient limited by lethargy;Other (comment) (cognition) Patient left: in bed;with call bell/phone within reach;with bed alarm set;with family/visitor present   PT Visit Diagnosis: Muscle weakness (generalized) (M62.81);Difficulty in walking, not elsewhere classified (R26.2);Pain Pain - Right/Left: Right Pain - part of body: Hip     Time: 1030-1053 PT Time Calculation (min) (ACUTE ONLY): 23 min  Charges:  $Therapeutic Exercise: 23-37 mins                    G Codes:        Scot Dock, PTA 05/28/2017, 3:05 PM

## 2017-05-28 NOTE — Progress Notes (Signed)
Patient is medically stable for D/C to [facility] today[facility]. Palliative care to follow at facility. Clydie BraunKaren hospice liaison is aware of above. RN will call report and arrange EMS for transport. Clinical Child psychotherapistocial Worker (CSW) sent D/C orders to [facility]. Patient's daughter Gavin PoundDeborah is at bedside and aware of above. Please reconsult if future social work needs arise. CSW signing off.   Baker Hughes IncorporatedBailey Danniella Robben, LCSW [phone]

## 2017-05-28 NOTE — Discharge Instructions (Signed)
Palliaitve care to folloow

## 2017-06-05 NOTE — Op Note (Signed)
Kennedy BuckerMenz, Nollan Muldrow, MD Physician Signed Orthopedics  Op Note Date of Service: 05/27/2017 9:51 AM      [] Hide copied text 05/24/2017 1:50 PM  Service: Orthopedics Editor: Kennedy BuckerMenz, Saje Gallop, MD (Physician) Status: Addendum  [] Hide copied text [] Hover for attribution information 05/21/2017 - 05/24/2017  1:50 PM  PATIENT: Spencer BerryEurell C Stanfield89 y.o.male  PRE-OPERATIVE DIAGNOSIS: right hip fracturesubtrochanteric, reverse obliquity  POST-OPERATIVE DIAGNOSIS: right hip fracture  PROCEDURE: Procedure(s): INTRAMEDULLARY (IM) NAIL FEMORAL (N/A)  SURGEON: Leitha SchullerMichael J Tenecia Ignasiak, MD  ASSISTANTS: None  ANESTHESIA: spinal  EBL: Total I/O In: 1346 [I.V.:1050; Blood:296] Out: -   BLOOD ADMINISTERED:none  DRAINS: none  LOCAL MEDICATIONS USED: NONE  SPECIMEN: No Specimen  DISPOSITION OF SPECIMEN: N/A  COUNTS: YES  TOURNIQUET: * No tourniquets in log *  IMPLANTS: Biomet affixes 9 x 4 4030 rod with 100 mm lag screw 54 mm interference screw, 1 cerclage wire  DICTATION: .Dragon Dictationpatient brought the operating room and after adequate anesthesia was obtained and Foley placed, the patient was transferred to the fracture table where right leg was placed in the traction boot with traction applied left leg in the well-leg holder. C-arm was brought in and good alignment was obtained. After prepping and draping in sterile fashion appropriate patient identification and timeout procedure completed. This point the procedure was opening up the lateral femur with a lateral approach incising the IT band elevated and SL's placing a clamp around the fracture site to reduce the lateral spike. After this was accomplished was a some difficulty a wire was passed and tightened and crimped this gave good alignment and stability such that a rod could be placed. A small incision was made proximal to the greater trochanter and proximal drilling carried out after guidewire was inserted in the  appropriate position and a guide were inserted down the canal. Measurement was made off of this to determine rod length. The reaming was carried out to 11 mm which gave chatter and so 9 x 4 40 rod was inserted to the appropriate depth. A screw was placed into the head after first placing a guidewire measuring off of this drilling and then placing the screw this was tightened down is essentially a fixed angle device as it would not be compressing with a reverse obliquity. Next the traction was released and a distal interlocking screw was placed through the oblique hole first drilling and measurement and then the 54 mm screw. Permanent C-arm views were obtained the wounds were irrigated fibula was placed at the level of the vastus lateralis to minimize postoperative bleeding with him being on anticoagulation. The IT band was closed in a running #1 Vicryl 2-0 Vicryl subcutaneous and skin staples. Xeroform and honeycomb dressings applied  PLAN OF CARE: Continue as an inpatient  PATIENT DISPOSITION: PACU - hemodynamically stable.

## 2017-06-16 DEATH — deceased

## 2018-03-26 IMAGING — XA DG HIP (WITH PELVIS) OPERATIVE*R*
3 series · 3 of 3 positions shown · non-contrast
Comparison: May 21, 2017

CLINICAL DATA: Status post hip fracture repair

EXAM:
OPERATIVE RIGHT HIP (WITH PELVIS IF PERFORMED) 3 VIEWS
TECHNIQUE: Fluoroscopic spot image(s) were submitted for interpretation
post-operatively.

[Series 9: ortho standard · 1 of 1 slices shown (1 of 3)]
[im 1/1]
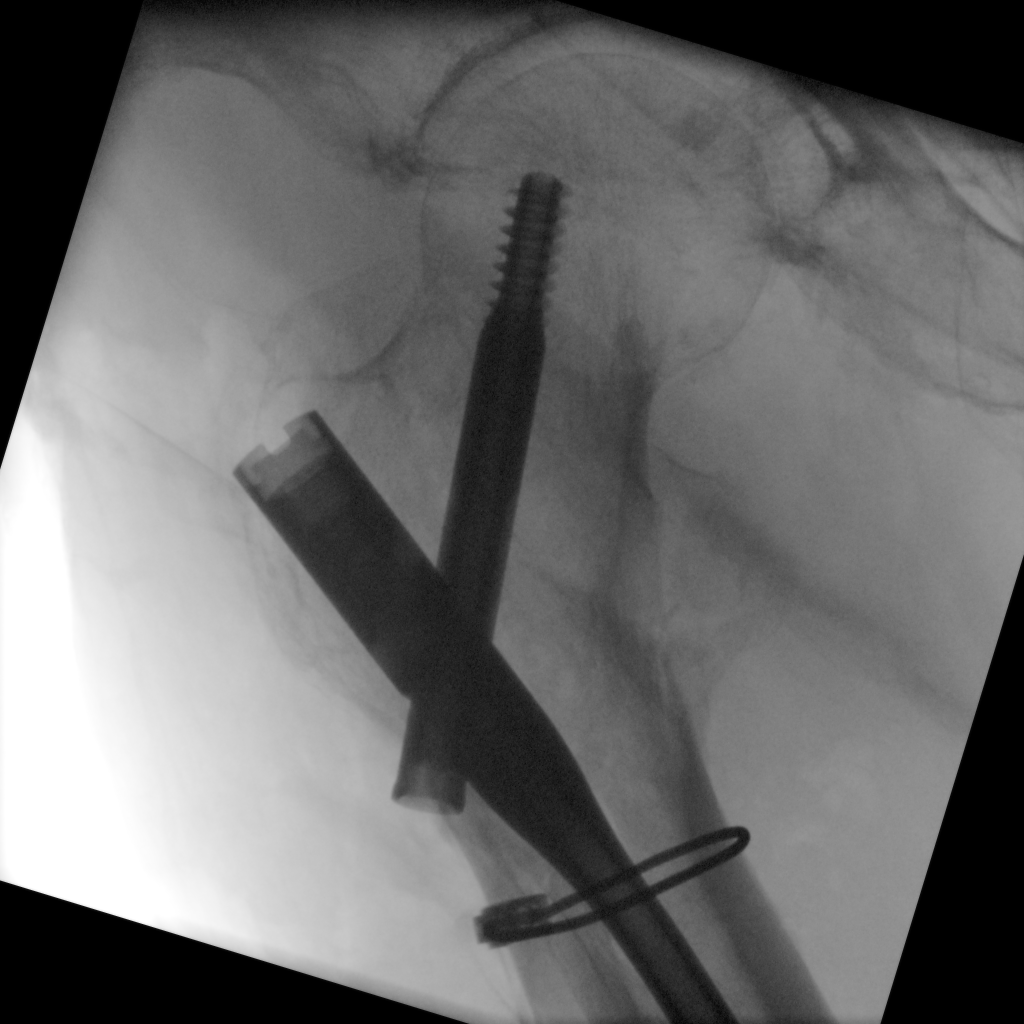

[Series 10: ortho standard · 1 of 1 slices shown (2 of 3)]
[im 1/1]
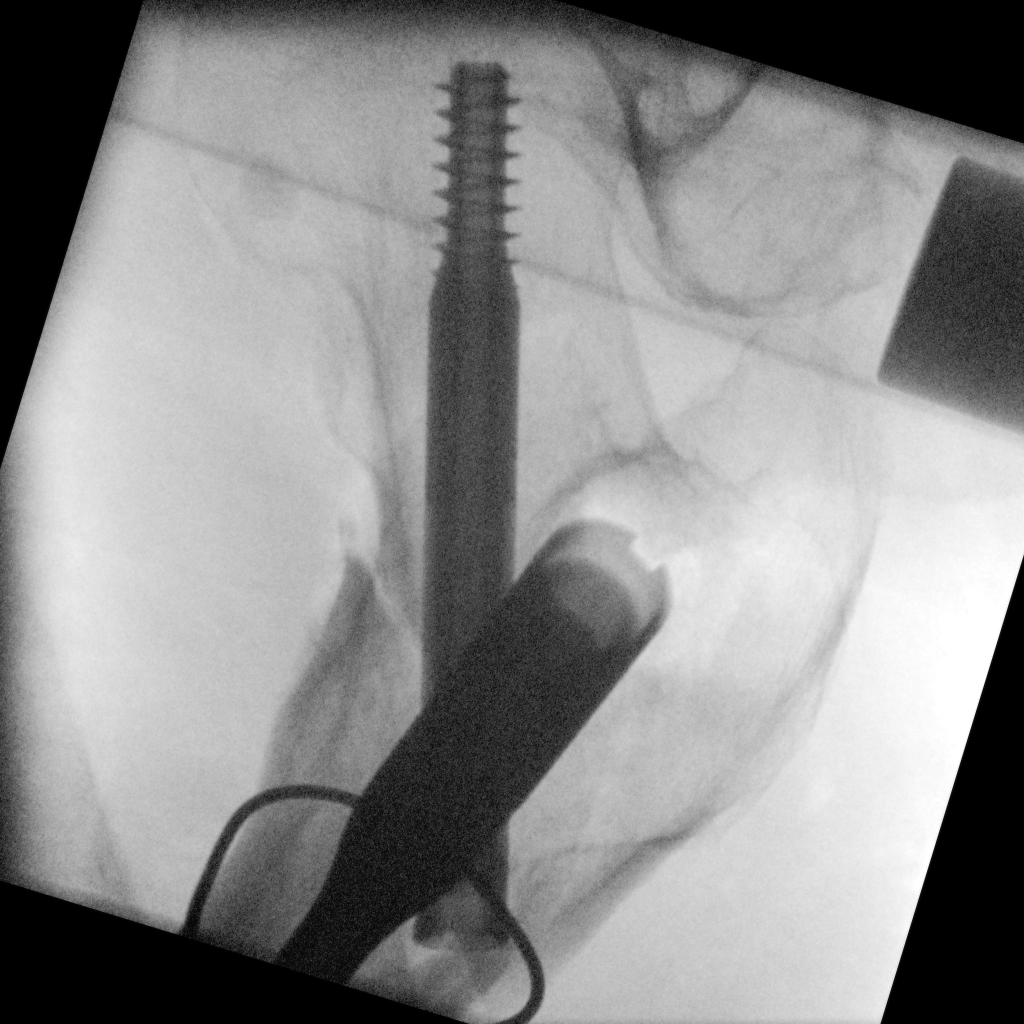

[Series 12: ortho standard · 1 of 1 slices shown (3 of 3)]
[im 1/1]
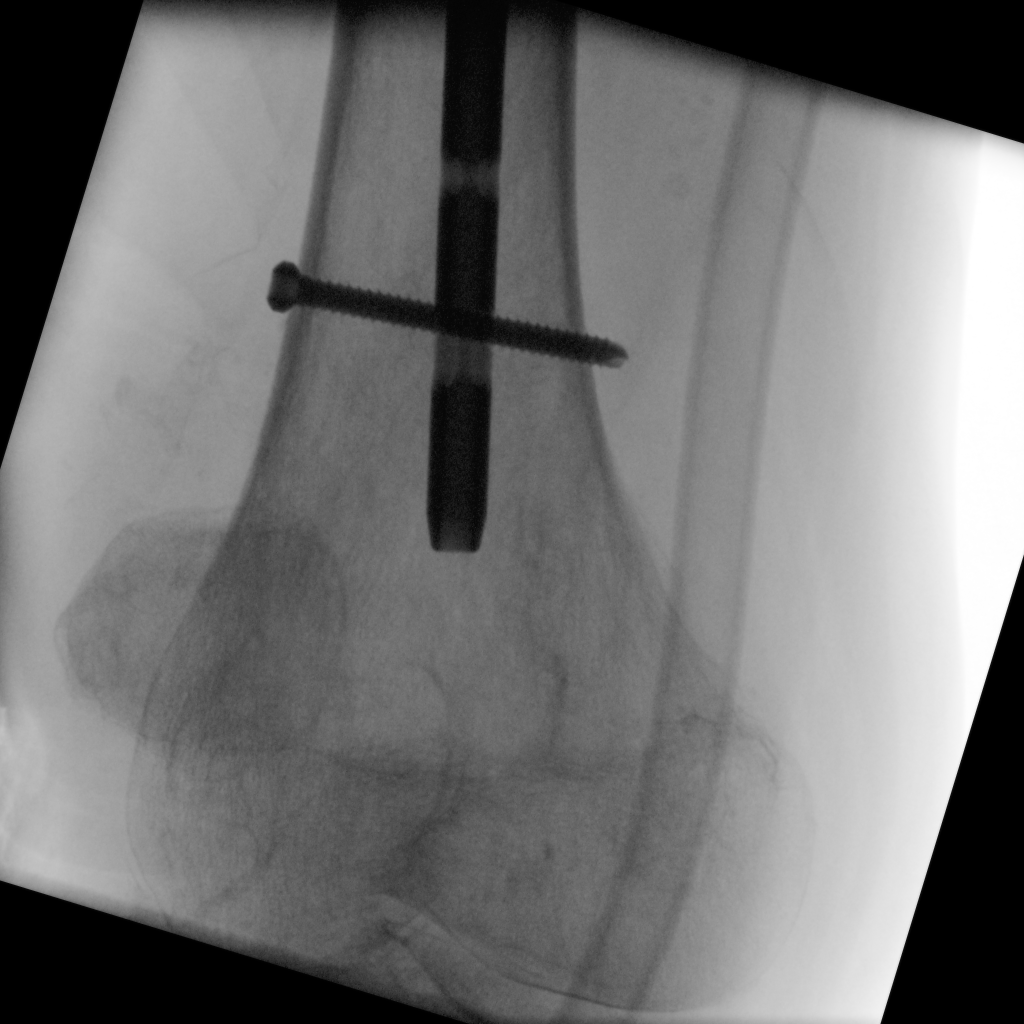

[3 of 3 positions shown; findings below may reference images not displayed]

FINDINGS: A rod and gamma nail now extend across the right hip fracture with a
proximal cerclage wire and a distal interlocking screw.
IMPRESSION: Right hip fracture repair as above.

## 2018-03-27 IMAGING — CT CT HEAD W/O CM
3 of 4 series · 15 of 47 positions shown, 18 images · non-contrast
Comparison: 05/21/2017 and 09/07/2015 head CTs

CLINICAL DATA: Dysphagia and weakness.

EXAM:
CT HEAD WITHOUT CONTRAST
TECHNIQUE: Contiguous axial images were obtained from the base of the skull
through the vertex without intravenous contrast.

[Series 3: head wo · axial · 0.47mm/px · z∈[-215,-80]mm · 9 of 33 slices shown, 12 images]
[im 3/33  brain]
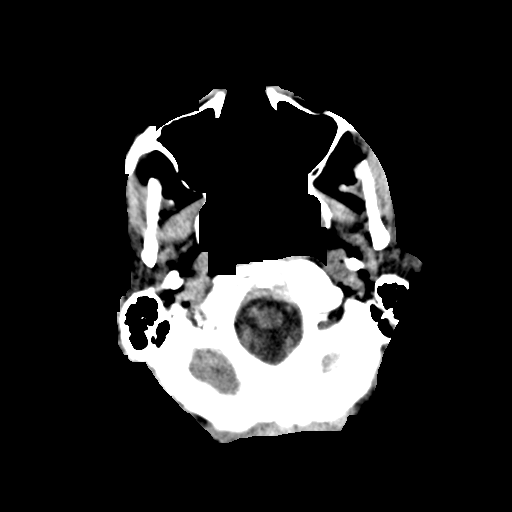
[im 3/33  bone]
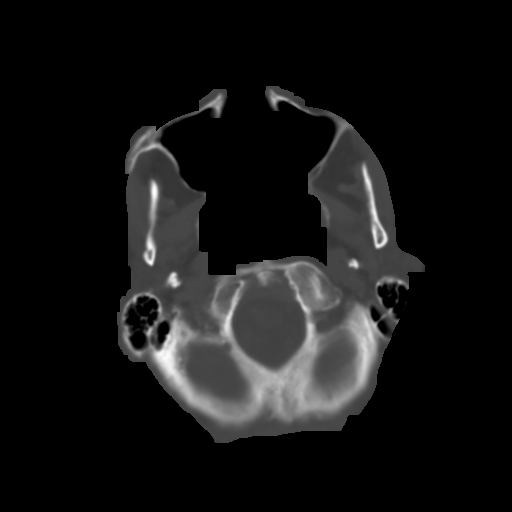
[im 7/33  brain]
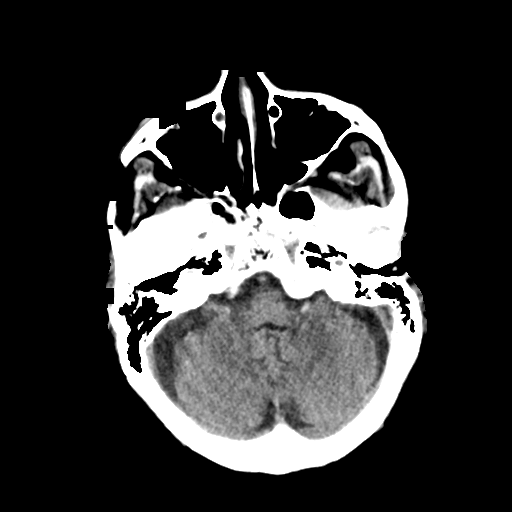
[im 10/33  brain]
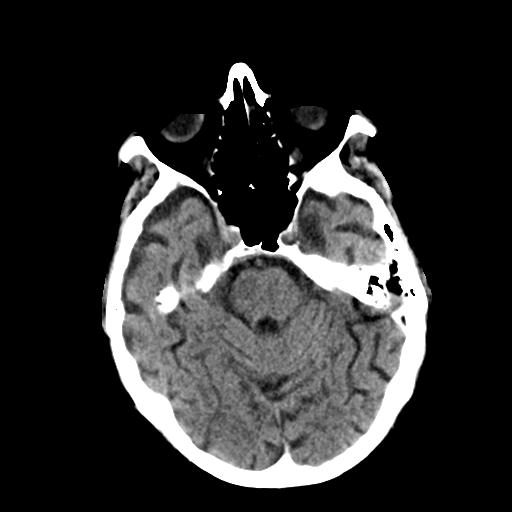
[im 14/33  brain]
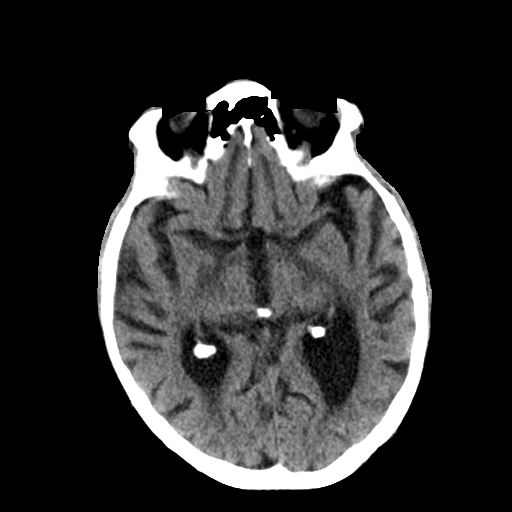
[im 17/33  brain]
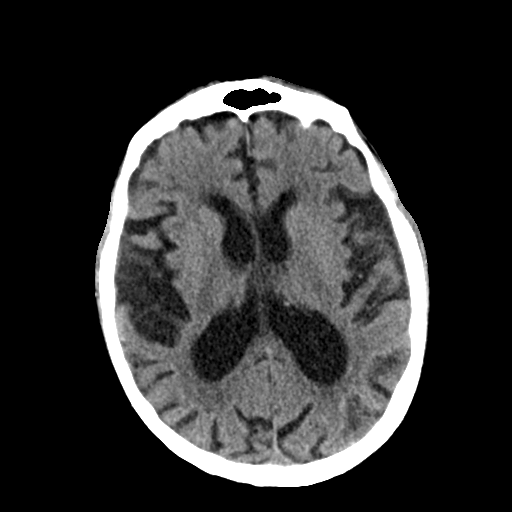
[im 17/33  bone]
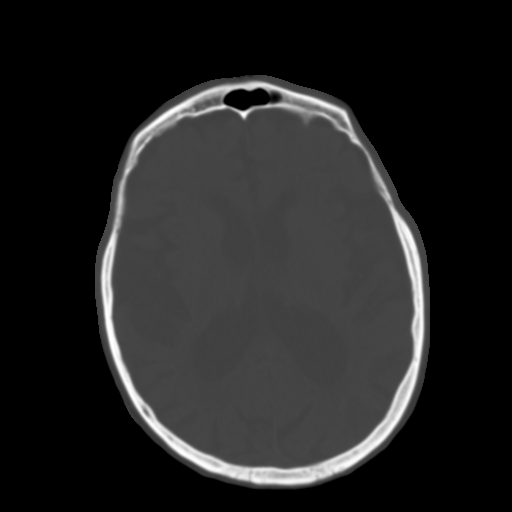
[im 19/33  brain]
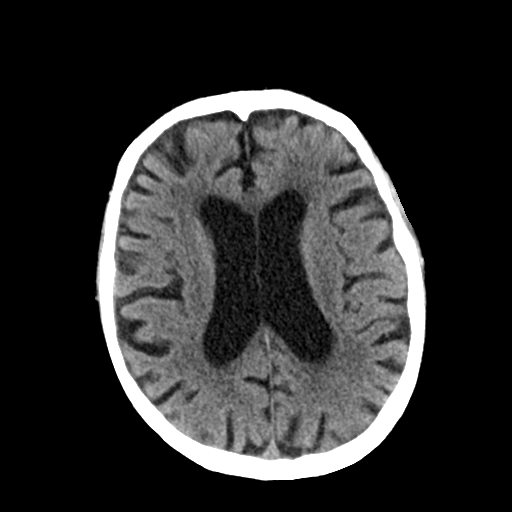
[im 23/33  brain]
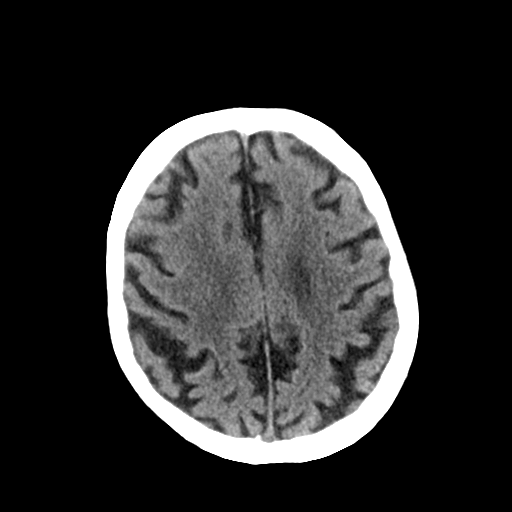
[im 26/33  brain]
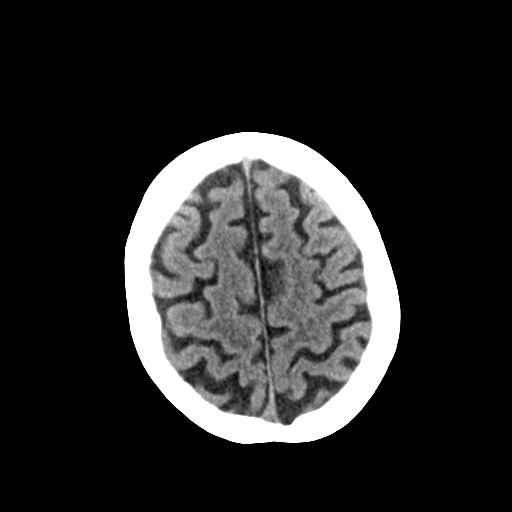
[im 30/33  brain]
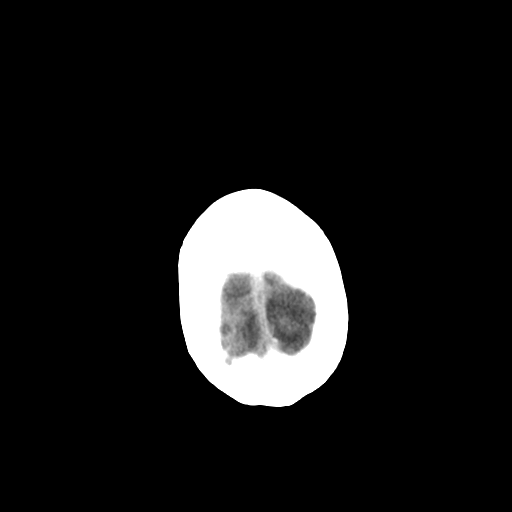
[im 30/33  bone]
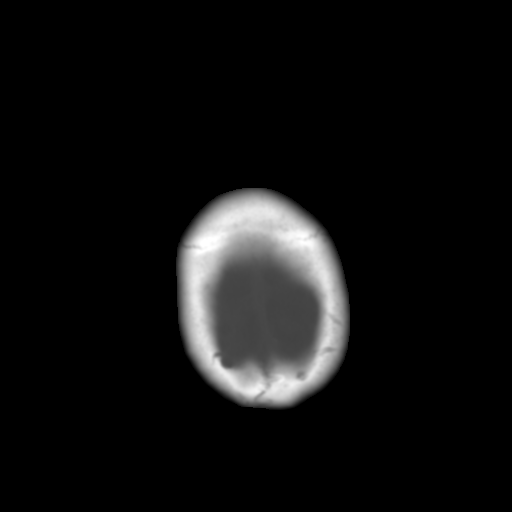

[Series 4: coronal soft tissue · coronal · 0.33mm/px · 3 of 68 slices shown]
[im 25/68  brain]
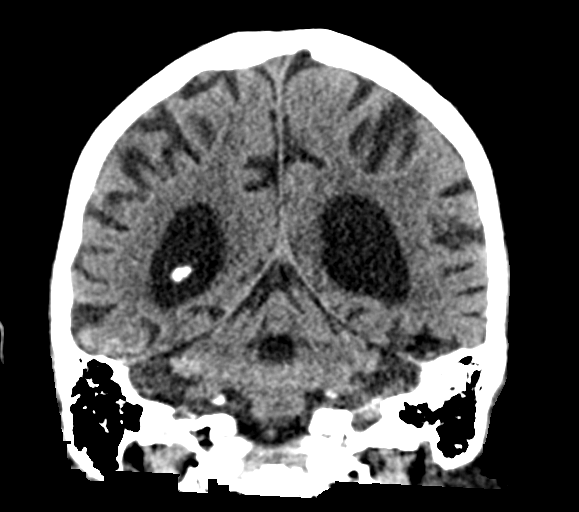
[im 31/68  brain]
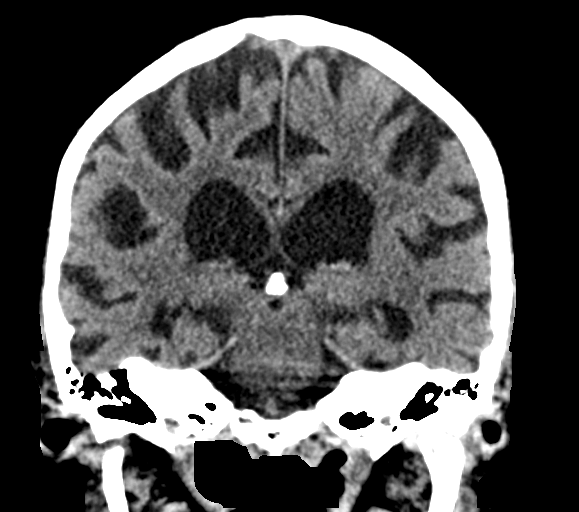
[im 37/68  brain]
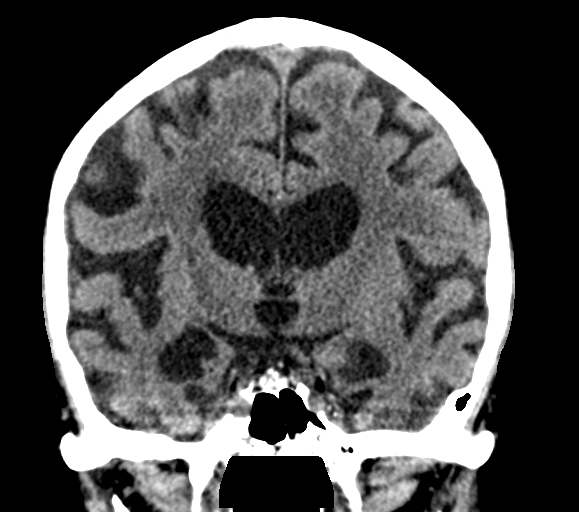

[Series 5: sagittal soft tissue · sagittal · 0.32mm/px · 3 of 58 slices shown]
[im 20/58  brain]
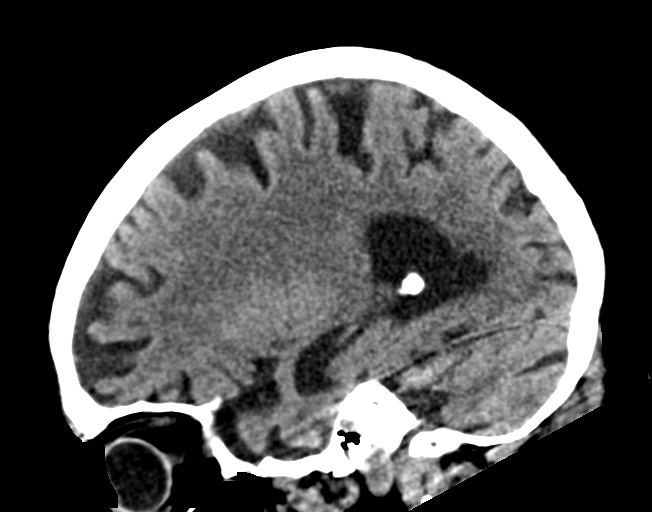
[im 29/58  brain]
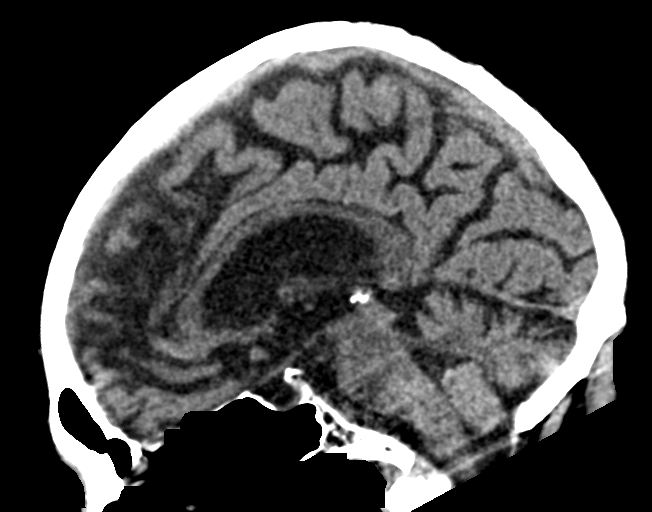
[im 39/58  brain]
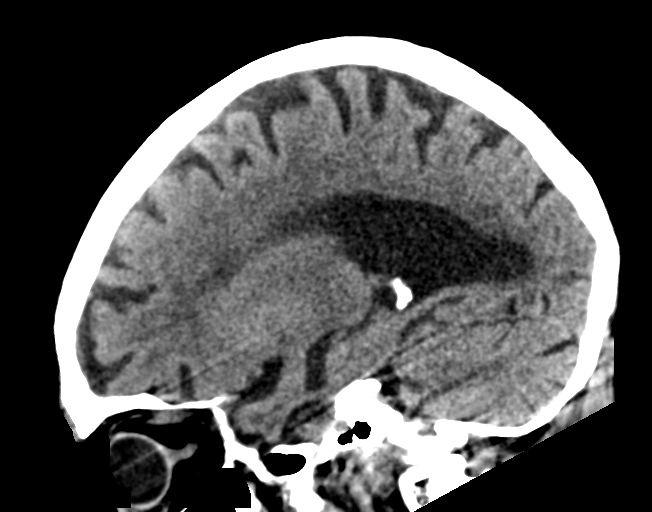

[15 of 47 positions shown; findings below may reference images not displayed]

FINDINGS: Brain: No evidence of acute infarction, hemorrhage, hydrocephalus,
extra-axial collection or mass lesion/mass effect.

Moderate atrophy and chronic small-vessel white matter ischemic
changes again noted.

Vascular: No hyperdense vessel or unexpected calcification.

Skull: Normal. Negative for fracture or focal lesion.

Sinuses/Orbits: No acute finding.

Other: None.
IMPRESSION: No evidence of acute intracranial abnormality.

Atrophy and chronic small-vessel white matter ischemic changes.
